# Patient Record
Sex: Male | Born: 1935 | Race: White | Hispanic: No | Marital: Married | Smoking: Former smoker
Health system: Southern US, Community
[De-identification: ages and names within clinical notes are randomized; demographics above are authoritative.]

## PROBLEM LIST (undated history)

## (undated) DIAGNOSIS — M159 Polyosteoarthritis, unspecified: Secondary | ICD-10-CM

## (undated) DIAGNOSIS — H269 Unspecified cataract: Secondary | ICD-10-CM

## (undated) DIAGNOSIS — H409 Unspecified glaucoma: Secondary | ICD-10-CM

## (undated) DIAGNOSIS — J449 Chronic obstructive pulmonary disease, unspecified: Secondary | ICD-10-CM

## (undated) DIAGNOSIS — J189 Pneumonia, unspecified organism: Secondary | ICD-10-CM

## (undated) DIAGNOSIS — I350 Nonrheumatic aortic (valve) stenosis: Secondary | ICD-10-CM

## (undated) DIAGNOSIS — K635 Polyp of colon: Secondary | ICD-10-CM

## (undated) DIAGNOSIS — I10 Essential (primary) hypertension: Secondary | ICD-10-CM

## (undated) DIAGNOSIS — K760 Fatty (change of) liver, not elsewhere classified: Secondary | ICD-10-CM

## (undated) DIAGNOSIS — K2270 Barrett's esophagus without dysplasia: Secondary | ICD-10-CM

## (undated) DIAGNOSIS — I509 Heart failure, unspecified: Secondary | ICD-10-CM

## (undated) DIAGNOSIS — I2510 Atherosclerotic heart disease of native coronary artery without angina pectoris: Secondary | ICD-10-CM

## (undated) DIAGNOSIS — I4891 Unspecified atrial fibrillation: Secondary | ICD-10-CM

## (undated) DIAGNOSIS — E785 Hyperlipidemia, unspecified: Secondary | ICD-10-CM

## (undated) DIAGNOSIS — IMO0001 Reserved for inherently not codable concepts without codable children: Secondary | ICD-10-CM

## (undated) DIAGNOSIS — M5030 Other cervical disc degeneration, unspecified cervical region: Secondary | ICD-10-CM

## (undated) DIAGNOSIS — E119 Type 2 diabetes mellitus without complications: Secondary | ICD-10-CM

## (undated) DIAGNOSIS — M109 Gout, unspecified: Secondary | ICD-10-CM

## (undated) DIAGNOSIS — C4491 Basal cell carcinoma of skin, unspecified: Secondary | ICD-10-CM

## (undated) DIAGNOSIS — Z9109 Other allergy status, other than to drugs and biological substances: Secondary | ICD-10-CM

## (undated) DIAGNOSIS — G629 Polyneuropathy, unspecified: Secondary | ICD-10-CM

## (undated) DIAGNOSIS — K219 Gastro-esophageal reflux disease without esophagitis: Secondary | ICD-10-CM

## (undated) DIAGNOSIS — C029 Malignant neoplasm of tongue, unspecified: Secondary | ICD-10-CM

## (undated) DIAGNOSIS — D759 Disease of blood and blood-forming organs, unspecified: Secondary | ICD-10-CM

## (undated) HISTORY — DX: Type 2 diabetes mellitus without complications: E11.9

## (undated) HISTORY — PX: OTHER SURGICAL HISTORY: SHX169

## (undated) HISTORY — PX: CATARACT EXTRACTION: SUR2

## (undated) HISTORY — DX: Gastro-esophageal reflux disease without esophagitis: K21.9

## (undated) HISTORY — DX: Essential (primary) hypertension: I10

## (undated) HISTORY — PX: HIP SURGERY: SHX245

## (undated) HISTORY — DX: Reserved for inherently not codable concepts without codable children: IMO0001

## (undated) HISTORY — PX: HIP ARTHROPLASTY: SHX981

---

## 2003-06-15 ENCOUNTER — Other Ambulatory Visit: Payer: Self-pay

## 2007-01-02 ENCOUNTER — Ambulatory Visit: Payer: Self-pay | Admitting: Unknown Physician Specialty

## 2008-02-11 ENCOUNTER — Ambulatory Visit: Payer: Self-pay | Admitting: Internal Medicine

## 2008-07-10 ENCOUNTER — Ambulatory Visit: Payer: Self-pay | Admitting: Specialist

## 2008-07-23 ENCOUNTER — Inpatient Hospital Stay: Payer: Self-pay | Admitting: Internal Medicine

## 2009-02-16 ENCOUNTER — Ambulatory Visit: Payer: Self-pay | Admitting: Internal Medicine

## 2009-10-13 ENCOUNTER — Ambulatory Visit: Payer: Self-pay | Admitting: Unknown Physician Specialty

## 2009-10-24 ENCOUNTER — Ambulatory Visit: Payer: Self-pay | Admitting: Unknown Physician Specialty

## 2010-01-11 ENCOUNTER — Ambulatory Visit: Payer: Self-pay | Admitting: Ophthalmology

## 2010-01-24 ENCOUNTER — Ambulatory Visit: Payer: Self-pay | Admitting: Ophthalmology

## 2010-09-30 ENCOUNTER — Observation Stay: Payer: Self-pay | Admitting: General Practice

## 2010-10-06 ENCOUNTER — Ambulatory Visit: Payer: Self-pay | Admitting: Otolaryngology

## 2010-10-11 ENCOUNTER — Ambulatory Visit: Payer: Self-pay | Admitting: Otolaryngology

## 2010-10-13 LAB — PATHOLOGY REPORT

## 2010-11-07 ENCOUNTER — Ambulatory Visit: Payer: Self-pay | Admitting: General Practice

## 2010-11-20 ENCOUNTER — Inpatient Hospital Stay: Payer: Self-pay | Admitting: General Practice

## 2011-02-21 ENCOUNTER — Ambulatory Visit: Payer: Self-pay | Admitting: Surgery

## 2011-03-02 ENCOUNTER — Ambulatory Visit: Payer: Self-pay | Admitting: General Practice

## 2011-03-08 ENCOUNTER — Ambulatory Visit: Payer: Self-pay | Admitting: Cardiology

## 2011-03-10 ENCOUNTER — Emergency Department: Payer: Self-pay | Admitting: *Deleted

## 2011-10-25 ENCOUNTER — Ambulatory Visit: Payer: Self-pay | Admitting: Internal Medicine

## 2011-10-30 ENCOUNTER — Ambulatory Visit: Payer: Self-pay | Admitting: Internal Medicine

## 2011-10-30 LAB — IRON AND TIBC
Iron Bind.Cap.(Total): 232 ug/dL — ABNORMAL LOW (ref 250–450)
Iron Saturation: 73 %
Unbound Iron-Bind.Cap.: 62 ug/dL

## 2011-10-30 LAB — CBC CANCER CENTER
Basophil #: 0 x10 3/mm (ref 0.0–0.1)
Basophil: 3 %
Comment - H1-Com1: NORMAL
Eosinophil: 2 %
MCH: 33.9 pg (ref 26.0–34.0)
MCV: 99 fL (ref 80–100)
Monocytes: 7 %
Platelet: 107 x10 3/mm — ABNORMAL LOW (ref 150–440)
RBC: 4.75 10*6/uL (ref 4.40–5.90)
Segmented Neutrophils: 44 %

## 2011-10-30 LAB — FERRITIN: Ferritin (ARMC): 2445 ng/mL — ABNORMAL HIGH (ref 8–388)

## 2011-10-30 LAB — APTT: Activated PTT: 37 secs — ABNORMAL HIGH (ref 23.6–35.9)

## 2011-10-30 LAB — FOLATE: Folic Acid: 18.5 ng/mL (ref 3.1–100.0)

## 2011-10-30 LAB — FIBRIN DEGRADATION PROD.(ARMC ONLY): Fibrin Degradation Prod.: <10 (ref 2.1–7.7)

## 2011-10-30 LAB — RETICULOCYTES
Absolute Retic Count: 0.0316 10*6/uL (ref 0.024–0.084)
Reticulocyte: 0.67 % (ref 0.5–1.5)

## 2011-10-31 LAB — URINE IEP, RANDOM

## 2011-11-07 ENCOUNTER — Ambulatory Visit: Payer: Self-pay | Admitting: Internal Medicine

## 2011-11-20 LAB — CBC CANCER CENTER
Basophil: 1 %
HGB: 14.6 g/dL (ref 13.0–18.0)
Lymphocytes: 29 %
MCH: 33.6 pg (ref 26.0–34.0)
MCV: 100 fL (ref 80–100)
Monocytes: 7 %
Platelet: 92 x10 3/mm — ABNORMAL LOW (ref 150–440)
RBC: 4.34 10*6/uL — ABNORMAL LOW (ref 4.40–5.90)

## 2011-11-20 LAB — RETICULOCYTES: Reticulocyte: 1.01 % (ref 0.5–1.5)

## 2011-11-26 ENCOUNTER — Ambulatory Visit: Payer: Self-pay | Admitting: Internal Medicine

## 2011-12-11 ENCOUNTER — Ambulatory Visit: Payer: Self-pay | Admitting: Internal Medicine

## 2011-12-19 ENCOUNTER — Emergency Department: Payer: Self-pay | Admitting: Emergency Medicine

## 2011-12-21 ENCOUNTER — Ambulatory Visit: Payer: Self-pay | Admitting: Internal Medicine

## 2011-12-27 ENCOUNTER — Ambulatory Visit: Payer: Self-pay | Admitting: Internal Medicine

## 2012-03-12 ENCOUNTER — Ambulatory Visit: Payer: Self-pay | Admitting: Internal Medicine

## 2012-03-12 LAB — CBC CANCER CENTER
Basophil %: 0.9 %
Eosinophil #: 0.2 x10 3/mm (ref 0.0–0.7)
Eosinophil %: 4.2 %
HCT: 46.7 % (ref 40.0–52.0)
HGB: 15.7 g/dL (ref 13.0–18.0)
Lymphocyte %: 29.9 %
MCH: 34.8 pg — ABNORMAL HIGH (ref 26.0–34.0)
MCHC: 33.6 g/dL (ref 32.0–36.0)
Monocyte #: 0.5 x10 3/mm (ref 0.2–1.0)
Neutrophil #: 1.9 x10 3/mm (ref 1.4–6.5)
Neutrophil %: 51.5 %
RDW: 12.8 % (ref 11.5–14.5)

## 2012-03-28 ENCOUNTER — Ambulatory Visit: Payer: Self-pay | Admitting: Internal Medicine

## 2012-04-16 ENCOUNTER — Encounter: Payer: Self-pay | Admitting: Internal Medicine

## 2012-04-23 ENCOUNTER — Ambulatory Visit: Payer: Self-pay | Admitting: Unknown Physician Specialty

## 2012-04-25 LAB — PATHOLOGY REPORT

## 2012-04-27 ENCOUNTER — Encounter: Payer: Self-pay | Admitting: Internal Medicine

## 2012-05-28 ENCOUNTER — Ambulatory Visit: Payer: Self-pay | Admitting: Internal Medicine

## 2012-06-12 LAB — CBC CANCER CENTER
Basophil %: 0.6 %
Eosinophil #: 0.2 x10 3/mm (ref 0.0–0.7)
HCT: 46.7 % (ref 40.0–52.0)
HGB: 16.5 g/dL (ref 13.0–18.0)
Lymphocyte #: 1.2 x10 3/mm (ref 1.0–3.6)
MCH: 35.5 pg — ABNORMAL HIGH (ref 26.0–34.0)
MCV: 100 fL (ref 80–100)
Monocyte #: 0.3 x10 3/mm (ref 0.2–1.0)
Platelet: 112 x10 3/mm — ABNORMAL LOW (ref 150–440)
RDW: 12.6 % (ref 11.5–14.5)
WBC: 3.4 x10 3/mm — ABNORMAL LOW (ref 3.8–10.6)

## 2012-06-28 ENCOUNTER — Ambulatory Visit: Payer: Self-pay | Admitting: Internal Medicine

## 2012-09-08 ENCOUNTER — Ambulatory Visit: Payer: Self-pay | Admitting: Internal Medicine

## 2012-09-10 LAB — CBC CANCER CENTER
Basophil #: 0 x10 3/mm (ref 0.0–0.1)
Eosinophil #: 0.2 x10 3/mm (ref 0.0–0.7)
HCT: 49 % (ref 40.0–52.0)
MCH: 35.6 pg — ABNORMAL HIGH (ref 26.0–34.0)
MCHC: 34.9 g/dL (ref 32.0–36.0)
MCV: 102 fL — ABNORMAL HIGH (ref 80–100)
Monocyte #: 0.4 x10 3/mm (ref 0.2–1.0)
Monocyte %: 9.9 %
Neutrophil %: 62.5 %
Platelet: 130 x10 3/mm — ABNORMAL LOW (ref 150–440)
RBC: 4.8 10*6/uL (ref 4.40–5.90)
RDW: 12.9 % (ref 11.5–14.5)

## 2012-09-10 LAB — FOLATE: Folic Acid: 17.9 ng/mL (ref 3.1–100.0)

## 2012-09-25 ENCOUNTER — Ambulatory Visit: Payer: Self-pay | Admitting: Internal Medicine

## 2012-10-09 IMAGING — US US EXTREM LOW VENOUS*L*
1 series · 14 of 24 positions shown · non-contrast
Comparison: none

REASON FOR EXAM: STAT CR [id number] PGR Dr.Sarang  pain and swelling left
leg  eval DVT
COMMENTS:

PROCEDURE:     US  - US DOPPLER LOW EXTR LEFT  - December 21, 2011  [DATE]
RESULT:     The left femoral and popliteal veins are normally compressible.
The waveform patterns are normal and the color flow images are normal. The
response to the augmentation maneuver is normal.

[Series 1: us extrem low venous*left* · 0.09mm/px · 14 of 30 slices shown]
[im 1/30]
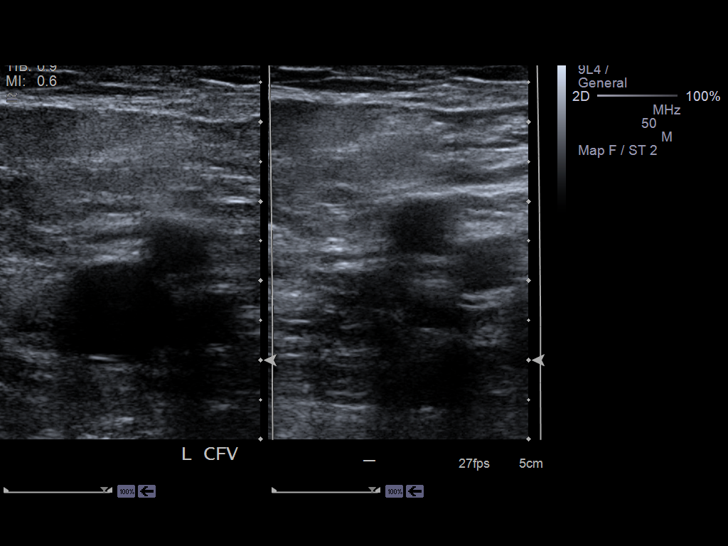
[im 3/30]
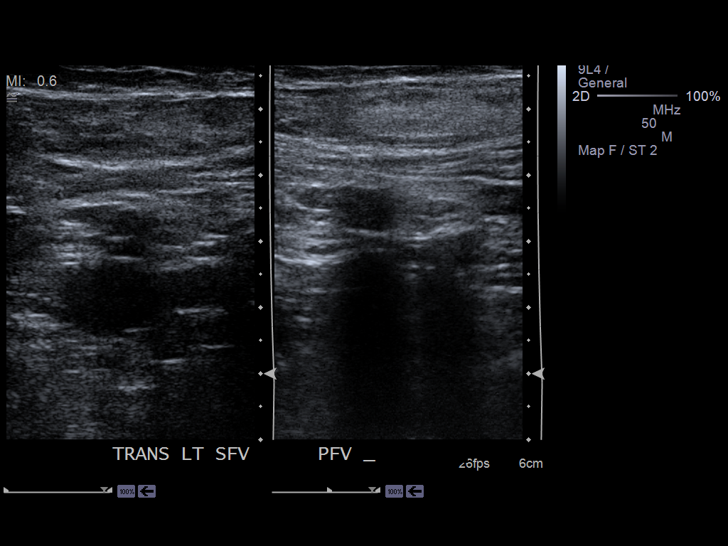
[im 6/30]
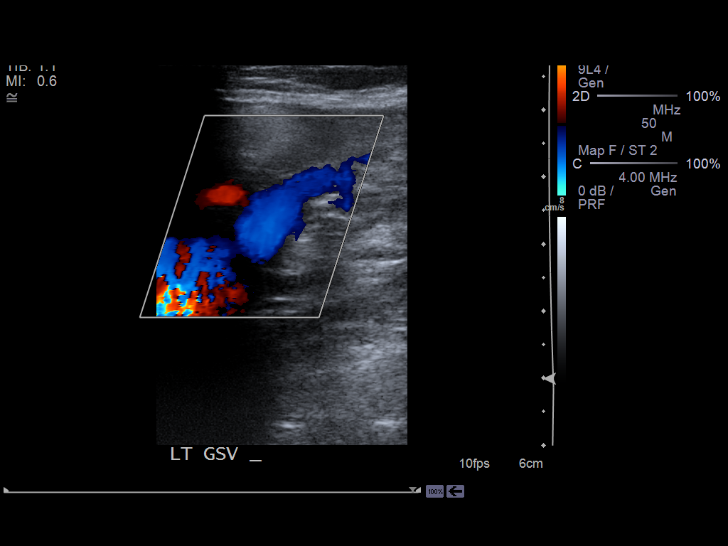
[im 8/30]
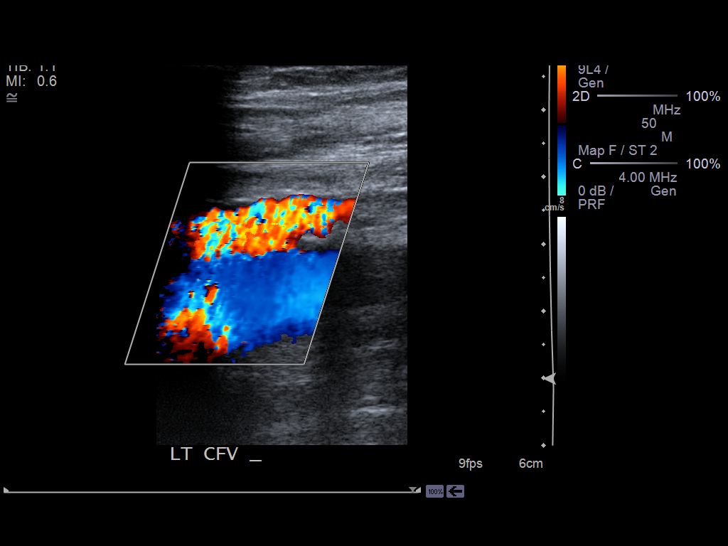
[im 9/30]
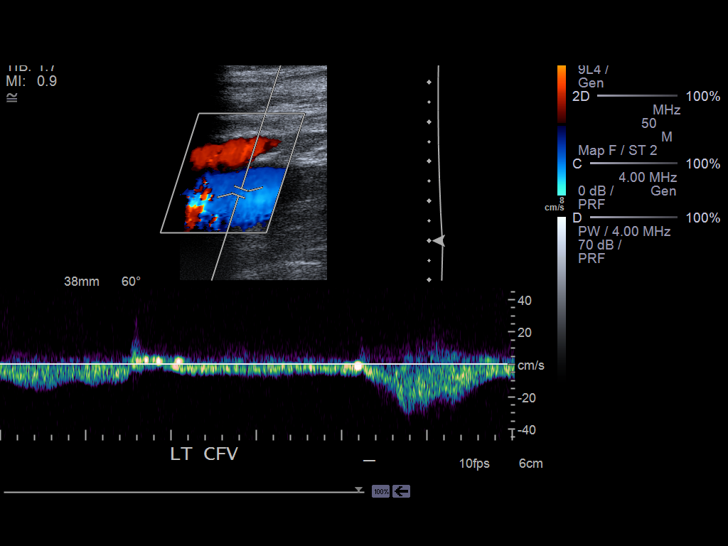
[im 12/30]
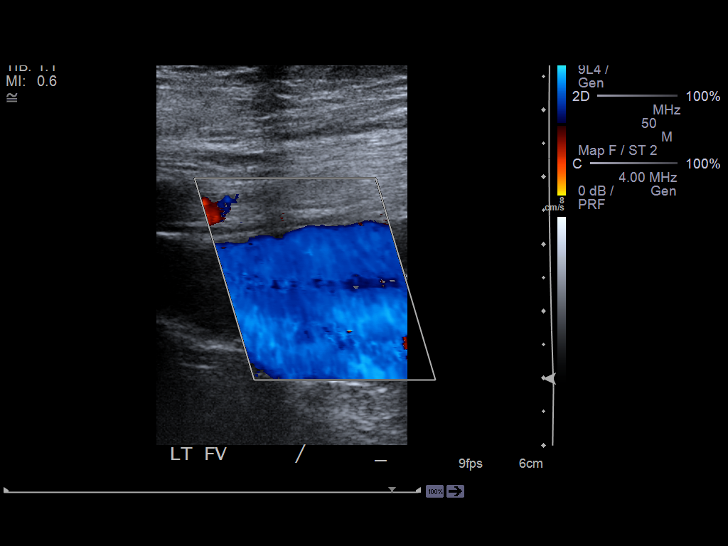
[im 14/30]
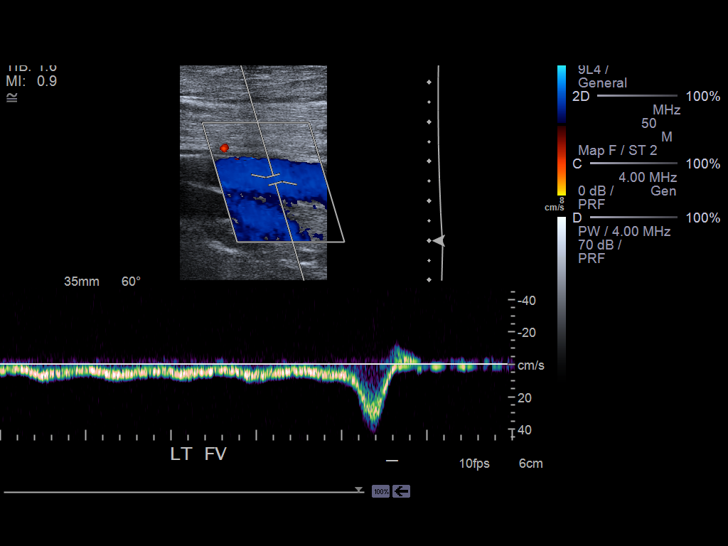
[im 16/30]
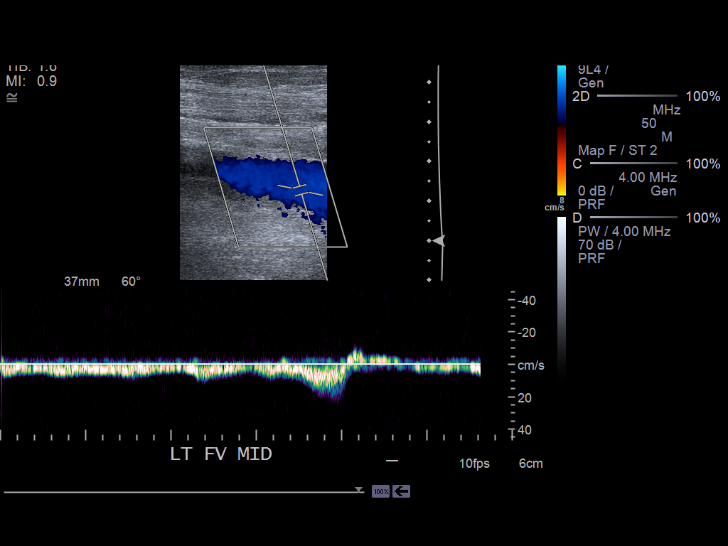
[im 18/30]
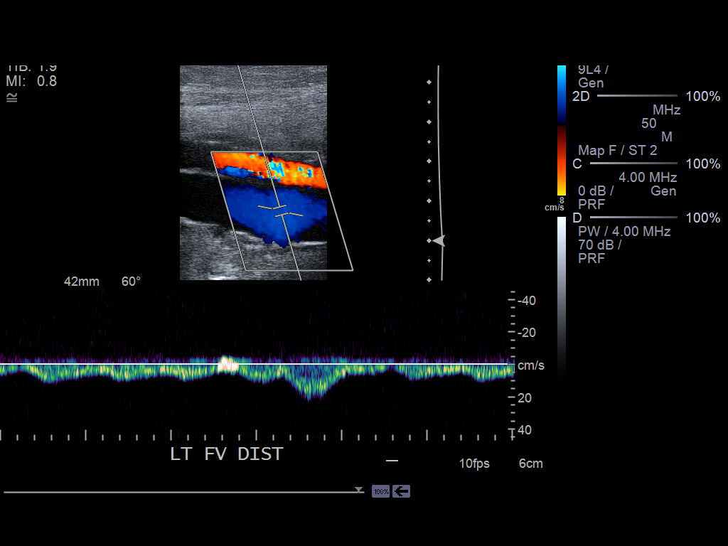
[im 21/30]
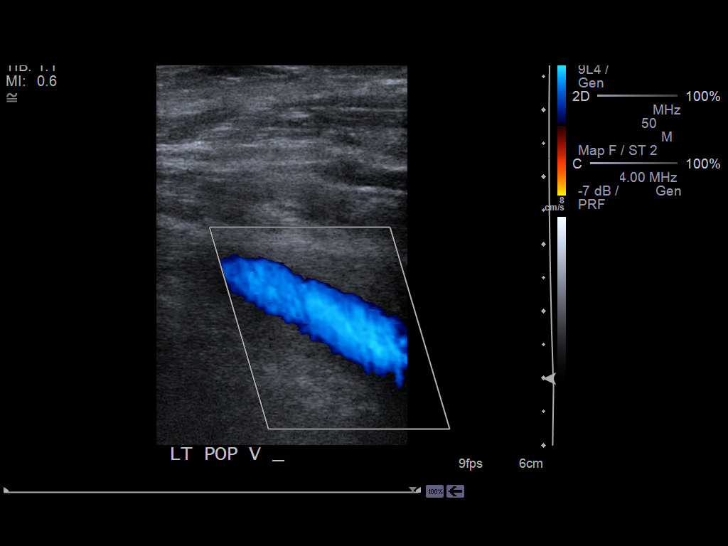
[im 23/30]
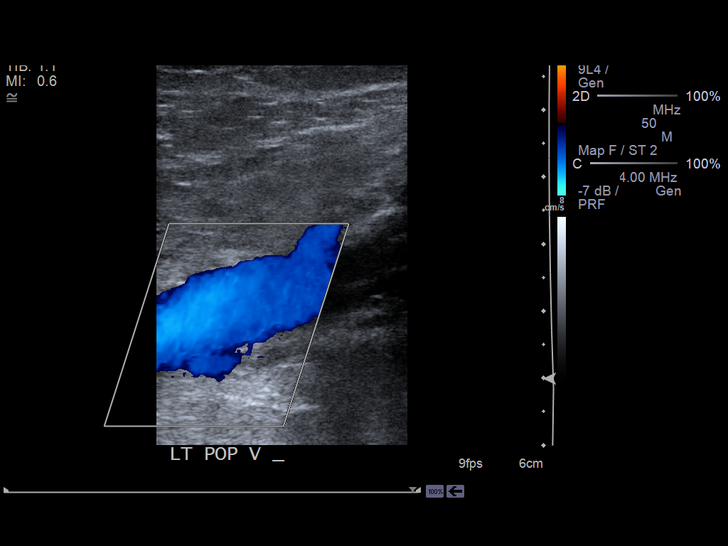
[im 24/30]
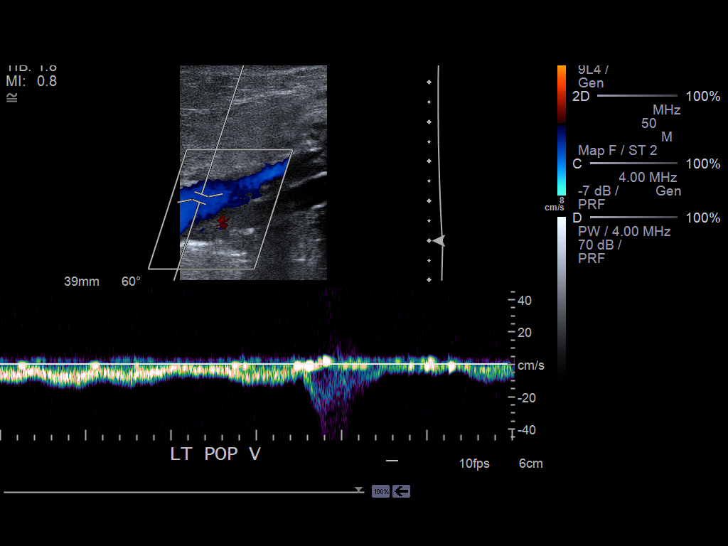
[im 27/30]
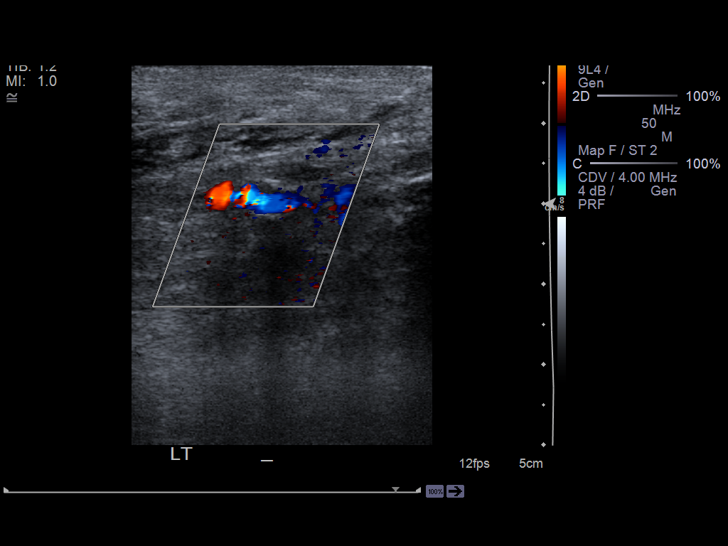
[im 30/30]
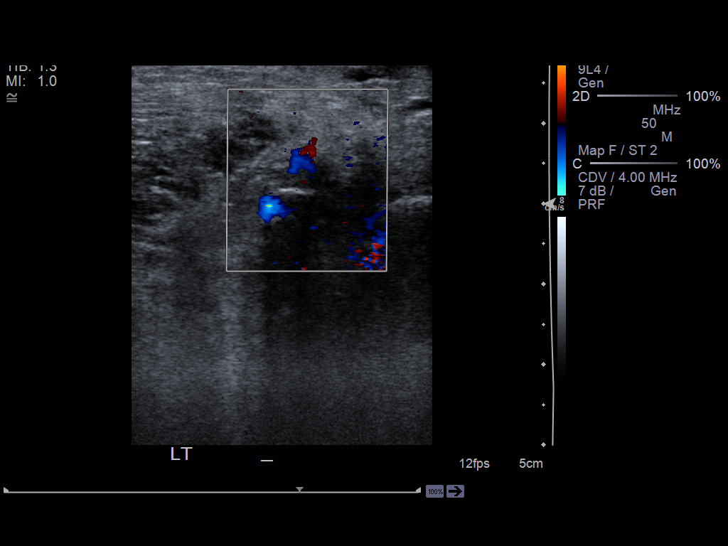

[14 of 24 positions shown; findings below may reference images not displayed]

IMPRESSION: There is no evidence of thrombus within the left femoral or
popliteal veins.

## 2012-11-11 ENCOUNTER — Ambulatory Visit: Payer: Self-pay | Admitting: Rheumatology

## 2012-11-25 ENCOUNTER — Ambulatory Visit: Payer: Self-pay | Admitting: Internal Medicine

## 2012-12-24 LAB — CBC CANCER CENTER
Basophil %: 1.3 %
Eosinophil #: 0.3 x10 3/mm (ref 0.0–0.7)
HCT: 41.9 % (ref 40.0–52.0)
HGB: 15.1 g/dL (ref 13.0–18.0)
Lymphocyte #: 1.1 x10 3/mm (ref 1.0–3.6)
Lymphocyte %: 40.6 %
MCHC: 36 g/dL (ref 32.0–36.0)
MCV: 102 fL — ABNORMAL HIGH (ref 80–100)
Monocyte #: 0.4 x10 3/mm (ref 0.2–1.0)
Monocyte %: 14.2 %
Platelet: 99 x10 3/mm — ABNORMAL LOW (ref 150–440)
RBC: 4.11 10*6/uL — ABNORMAL LOW (ref 4.40–5.90)
RDW: 12.9 % (ref 11.5–14.5)
WBC: 2.8 x10 3/mm — ABNORMAL LOW (ref 3.8–10.6)

## 2012-12-26 ENCOUNTER — Ambulatory Visit: Payer: Self-pay | Admitting: Internal Medicine

## 2013-04-29 ENCOUNTER — Ambulatory Visit: Payer: Self-pay | Admitting: Internal Medicine

## 2013-04-29 LAB — CBC CANCER CENTER
Basophil #: 0 x10 3/mm (ref 0.0–0.1)
HCT: 43.6 % (ref 40.0–52.0)
Lymphocyte #: 0.9 x10 3/mm — ABNORMAL LOW (ref 1.0–3.6)
MCH: 35.4 pg — ABNORMAL HIGH (ref 26.0–34.0)
MCHC: 34.3 g/dL (ref 32.0–36.0)
Monocyte %: 14.6 %
Platelet: 87 x10 3/mm — ABNORMAL LOW (ref 150–440)
RBC: 4.22 10*6/uL — ABNORMAL LOW (ref 4.40–5.90)
RDW: 12.9 % (ref 11.5–14.5)
WBC: 2.3 x10 3/mm — ABNORMAL LOW (ref 3.8–10.6)

## 2013-05-06 ENCOUNTER — Encounter: Payer: Self-pay | Admitting: Podiatry

## 2013-05-06 ENCOUNTER — Ambulatory Visit (INDEPENDENT_AMBULATORY_CARE_PROVIDER_SITE_OTHER): Payer: Medicare Other | Admitting: Podiatry

## 2013-05-06 VITALS — BP 138/63 | HR 65 | Resp 16 | Ht 76.0 in | Wt 250.0 lb

## 2013-05-06 DIAGNOSIS — B351 Tinea unguium: Secondary | ICD-10-CM

## 2013-05-06 DIAGNOSIS — M79609 Pain in unspecified limb: Secondary | ICD-10-CM

## 2013-05-06 NOTE — Progress Notes (Signed)
   Subjective:    Patient ID: [name], male    DOB: Sep 08, 1931, 79 y.o.   MRN: [id number]  HPI Comments: "just the toenails"     Review of Systems     Objective:   Physical Exam: Pulses are palpable bilateral. Nails are thick yellow dystrophic clinically mycotic and painful on palpation to        Assessment & Plan:  Assessment: Pain in limb secondary to onychomycosis.  Plan: Debridement of nails 1 through 5 bilateral covered service secondary to pain. Followup with her in 3 months.

## 2013-05-28 ENCOUNTER — Ambulatory Visit: Payer: Self-pay | Admitting: Internal Medicine

## 2013-08-05 ENCOUNTER — Ambulatory Visit: Payer: No Typology Code available for payment source | Admitting: Podiatry

## 2013-08-05 ENCOUNTER — Ambulatory Visit (INDEPENDENT_AMBULATORY_CARE_PROVIDER_SITE_OTHER): Payer: Medicare Other | Admitting: Podiatry

## 2013-08-05 VITALS — BP 78/59 | HR 77 | Resp 16 | Ht 76.0 in | Wt 250.0 lb

## 2013-08-05 DIAGNOSIS — M79609 Pain in unspecified limb: Secondary | ICD-10-CM

## 2013-08-05 DIAGNOSIS — B351 Tinea unguium: Secondary | ICD-10-CM

## 2013-08-05 NOTE — Progress Notes (Signed)
He presents today with a chief complaint of painful toenails one through 5 bilateral.  Objective: Vital signs are stable he is alert and oriented x3. Pulses are palpable bilateral. Nails are thick yellow dystrophic clinically mycotic and painful palpation as well as debridement.  Assessment: Pain in limb secondary to onychomycosis 1 through 5 bilateral.  Plan: Debridement of nails 1 through 5 bilateral.

## 2013-08-26 ENCOUNTER — Ambulatory Visit: Payer: Self-pay | Admitting: Internal Medicine

## 2013-08-26 LAB — CBC CANCER CENTER
Basophil #: 0 x10 3/mm (ref 0.0–0.1)
Basophil %: 0.6 %
Eosinophil #: 0.2 x10 3/mm (ref 0.0–0.7)
Eosinophil %: 5.1 %
HCT: 44.8 % (ref 40.0–52.0)
HGB: 15.2 g/dL (ref 13.0–18.0)
LYMPHS ABS: 0.9 x10 3/mm — AB (ref 1.0–3.6)
Lymphocyte %: 21.9 %
MCH: 35.1 pg — ABNORMAL HIGH (ref 26.0–34.0)
MCHC: 34 g/dL (ref 32.0–36.0)
MCV: 103 fL — AB (ref 80–100)
Monocyte #: 0.4 x10 3/mm (ref 0.2–1.0)
Monocyte %: 10.9 %
NEUTROS ABS: 2.4 x10 3/mm (ref 1.4–6.5)
Neutrophil %: 61.5 %
Platelet: 93 x10 3/mm — ABNORMAL LOW (ref 150–440)
RBC: 4.34 10*6/uL — AB (ref 4.40–5.90)
RDW: 12.9 % (ref 11.5–14.5)
WBC: 3.9 x10 3/mm (ref 3.8–10.6)

## 2013-08-26 LAB — FERRITIN: Ferritin (ARMC): 388 ng/mL (ref 8–388)

## 2013-08-26 LAB — FOLATE: Folic Acid: 28.4 ng/mL (ref 3.1–100.0)

## 2013-09-25 ENCOUNTER — Ambulatory Visit: Payer: Self-pay | Admitting: Internal Medicine

## 2013-11-04 ENCOUNTER — Ambulatory Visit: Payer: No Typology Code available for payment source | Admitting: Podiatry

## 2013-11-11 ENCOUNTER — Ambulatory Visit (INDEPENDENT_AMBULATORY_CARE_PROVIDER_SITE_OTHER): Payer: Medicare Other | Admitting: Podiatry

## 2013-11-11 VITALS — BP 134/46 | HR 83 | Resp 16

## 2013-11-11 DIAGNOSIS — B351 Tinea unguium: Secondary | ICD-10-CM

## 2013-11-11 DIAGNOSIS — M79609 Pain in unspecified limb: Secondary | ICD-10-CM

## 2013-11-11 NOTE — Progress Notes (Signed)
He presents today complaining of painful elongated toenails.  Objective: Pulses are palpable bilateral. Nails are thick yellow dystrophic with mycotic and painful palpation.  Assessment: Pain in limb secondary to onychomycosis 1 through 5 bilateral.  Plan: Debridement of nails 1 through 5 bilateral covered service secondary to pain.

## 2013-12-16 ENCOUNTER — Ambulatory Visit: Payer: Self-pay | Admitting: Unknown Physician Specialty

## 2013-12-21 ENCOUNTER — Ambulatory Visit (INDEPENDENT_AMBULATORY_CARE_PROVIDER_SITE_OTHER): Payer: Medicare Other | Admitting: Podiatry

## 2013-12-21 ENCOUNTER — Encounter: Payer: Self-pay | Admitting: Podiatry

## 2013-12-21 DIAGNOSIS — L03039 Cellulitis of unspecified toe: Secondary | ICD-10-CM

## 2013-12-21 NOTE — Patient Instructions (Signed)

## 2013-12-21 NOTE — Progress Notes (Signed)
[name] presents today chief complaint of trauma to his hallux nail right foot. He states that the nail was split.  Objective: Vital signs are stable he is alert and oriented x3. Pulses are palpable right lower extremity. Nail split and nearly avulsed hallux right along the fibular portion. There is no purulence but malodor is noted.  Assessment: Traumatic paronychia hallux right.  Plan: Partial temporary nail avulsion was performed after local anesthetic had been administered to the hallux right. He tolerated procedure well and I will followup with him in one week he was start soaking twice daily in Betadine and water.

## 2013-12-24 ENCOUNTER — Ambulatory Visit: Payer: Self-pay | Admitting: Internal Medicine

## 2013-12-25 LAB — CBC CANCER CENTER
Basophil #: 0 x10 3/mm (ref 0.0–0.1)
Basophil %: 1.3 %
Eosinophil #: 0.2 x10 3/mm (ref 0.0–0.7)
Eosinophil %: 6.2 %
HCT: 46.3 % (ref 40.0–52.0)
HGB: 15.5 g/dL (ref 13.0–18.0)
LYMPHS PCT: 29 %
Lymphocyte #: 0.9 x10 3/mm — ABNORMAL LOW (ref 1.0–3.6)
MCH: 34.8 pg — ABNORMAL HIGH (ref 26.0–34.0)
MCHC: 33.6 g/dL (ref 32.0–36.0)
MCV: 104 fL — AB (ref 80–100)
Monocyte #: 0.4 x10 3/mm (ref 0.2–1.0)
Monocyte %: 12.8 %
Neutrophil #: 1.5 x10 3/mm (ref 1.4–6.5)
Neutrophil %: 50.7 %
PLATELETS: 122 x10 3/mm — AB (ref 150–440)
RBC: 4.46 10*6/uL (ref 4.40–5.90)
RDW: 13.2 % (ref 11.5–14.5)
WBC: 3 x10 3/mm — AB (ref 3.8–10.6)

## 2013-12-26 ENCOUNTER — Ambulatory Visit: Payer: Self-pay | Admitting: Internal Medicine

## 2013-12-30 ENCOUNTER — Ambulatory Visit (INDEPENDENT_AMBULATORY_CARE_PROVIDER_SITE_OTHER): Payer: Medicare Other | Admitting: Podiatry

## 2013-12-30 VITALS — BP 133/61 | HR 76 | Resp 16

## 2013-12-30 DIAGNOSIS — M79676 Pain in unspecified toe(s): Secondary | ICD-10-CM

## 2013-12-30 DIAGNOSIS — M79609 Pain in unspecified limb: Secondary | ICD-10-CM

## 2013-12-30 NOTE — Progress Notes (Signed)
He presents today for followup of his partial nail avulsion hallux right.   objective: Vital signs are stable he is alert and oriented x3. There is no erythema edema cellulitis drainage or odor to the hallux right.  Assessment: Well-healing surgical toe hallux right.  Plan: Discontinue Betadine start with Epsom salts warm or soaks covered in the day and leave open at night. Continue soaking to completely resolved.

## 2014-01-13 ENCOUNTER — Ambulatory Visit: Payer: Self-pay | Admitting: Unknown Physician Specialty

## 2014-01-22 ENCOUNTER — Emergency Department: Payer: Self-pay | Admitting: Internal Medicine

## 2014-02-10 ENCOUNTER — Ambulatory Visit (INDEPENDENT_AMBULATORY_CARE_PROVIDER_SITE_OTHER): Payer: Medicare Other | Admitting: Podiatry

## 2014-02-10 ENCOUNTER — Ambulatory Visit: Payer: Medicare Other | Admitting: Podiatry

## 2014-02-10 DIAGNOSIS — B351 Tinea unguium: Secondary | ICD-10-CM

## 2014-02-10 DIAGNOSIS — M79676 Pain in unspecified toe(s): Secondary | ICD-10-CM

## 2014-02-10 DIAGNOSIS — M79609 Pain in unspecified limb: Secondary | ICD-10-CM

## 2014-02-10 NOTE — Progress Notes (Signed)
He presents today with a chief complaint of painful elongated toenails and calluses plantar aspect of the left foot. Has recently had cellulitis of the right leg secondary to be dermatological surgical procedure.  Objective: Pulses are palpable bilateral. Nails are thick yellow dystrophic with mycotic and painful palpation.  Assessment: Pain in limb secondary to onychomycosis 1 through 5 bilateral.  Plan: Debridement of nails 1 through 5 bilateral covered service secondary to pain.

## 2014-04-19 ENCOUNTER — Ambulatory Visit: Payer: Medicare Other | Admitting: Podiatry

## 2014-06-22 ENCOUNTER — Ambulatory Visit: Payer: Self-pay | Admitting: General Practice

## 2014-06-22 DIAGNOSIS — I10 Essential (primary) hypertension: Secondary | ICD-10-CM | POA: Diagnosis not present

## 2014-06-22 LAB — CBC
HCT: 40.3 % (ref 40.0–52.0)
HGB: 13.5 g/dL (ref 13.0–18.0)
MCH: 32.5 pg (ref 26.0–34.0)
MCHC: 33.5 g/dL (ref 32.0–36.0)
MCV: 97 fL (ref 80–100)
Platelet: 86 10*3/uL — ABNORMAL LOW (ref 150–440)
RBC: 4.16 10*6/uL — ABNORMAL LOW (ref 4.40–5.90)
RDW: 13.3 % (ref 11.5–14.5)
WBC: 2.8 10*3/uL — AB (ref 3.8–10.6)

## 2014-06-22 LAB — URINALYSIS, COMPLETE
Bacteria: NONE SEEN
Bilirubin,UR: NEGATIVE
Blood: NEGATIVE
Glucose,UR: NEGATIVE mg/dL (ref 0–75)
KETONE: NEGATIVE
Leukocyte Esterase: NEGATIVE
Nitrite: NEGATIVE
PH: 6 (ref 4.5–8.0)
Protein: NEGATIVE
SPECIFIC GRAVITY: 1.013 (ref 1.003–1.030)
Squamous Epithelial: NONE SEEN
WBC UR: 2 /HPF (ref 0–5)

## 2014-06-22 LAB — BASIC METABOLIC PANEL
ANION GAP: 10 (ref 7–16)
BUN: 8 mg/dL (ref 7–18)
CHLORIDE: 103 mmol/L (ref 98–107)
CO2: 26 mmol/L (ref 21–32)
CREATININE: 0.68 mg/dL (ref 0.60–1.30)
Calcium, Total: 8.9 mg/dL (ref 8.5–10.1)
EGFR (African American): >60
EGFR (Non-African Amer.): >60
Glucose: 193 mg/dL — ABNORMAL HIGH (ref 65–99)
Osmolality: 281 (ref 275–301)
POTASSIUM: 3.6 mmol/L (ref 3.5–5.1)
Sodium: 139 mmol/L (ref 136–145)

## 2014-06-22 LAB — HEMOGLOBIN A1C: Hemoglobin A1C: 7 % — ABNORMAL HIGH (ref 4.2–6.3)

## 2014-06-22 LAB — PROTIME-INR
INR: 1.1
Prothrombin Time: 14 secs (ref 11.5–14.7)

## 2014-06-22 LAB — SEDIMENTATION RATE: Erythrocyte Sed Rate: 17 mm/hr (ref 0–20)

## 2014-06-22 LAB — MRSA PCR SCREENING

## 2014-06-22 LAB — APTT: Activated PTT: 34.9 secs (ref 23.6–35.9)

## 2014-06-23 LAB — URINE CULTURE

## 2014-06-28 ENCOUNTER — Ambulatory Visit: Payer: Self-pay | Admitting: Internal Medicine

## 2014-06-30 LAB — CBC CANCER CENTER
Basophil #: 0.1 "x10 3/mm "
Basophil %: 0.6 %
Eosinophil #: 0.2 "x10 3/mm "
Eosinophil %: 2.2 %
HCT: 41.5 %
HGB: 13.8 g/dL
Lymphocyte %: 13.7 %
Lymphs Abs: 1.1 "x10 3/mm "
MCH: 31.9 pg
MCHC: 33.3 g/dL
MCV: 96 fL
Monocyte #: 0.6 "x10 3/mm "
Monocyte %: 7.4 %
Neutrophil #: 6 "x10 3/mm "
Neutrophil %: 76.1 %
Platelet: 101 "x10 3/mm " — ABNORMAL LOW
RBC: 4.34 "x10 6/mm " — ABNORMAL LOW
RDW: 13.2 %
WBC: 7.9 "x10 3/mm "

## 2014-07-02 LAB — CBC CANCER CENTER
BASOS ABS: 0.1 x10 3/mm (ref 0.0–0.1)
Basophil %: 1.2 %
EOS PCT: 3.2 %
Eosinophil #: 0.1 x10 3/mm (ref 0.0–0.7)
HCT: 40.2 % (ref 40.0–52.0)
HGB: 13.8 g/dL (ref 13.0–18.0)
LYMPHS PCT: 21.2 %
Lymphocyte #: 0.9 x10 3/mm — ABNORMAL LOW (ref 1.0–3.6)
MCH: 32.7 pg (ref 26.0–34.0)
MCHC: 34.5 g/dL (ref 32.0–36.0)
MCV: 95 fL (ref 80–100)
MONO ABS: 0.6 x10 3/mm (ref 0.2–1.0)
Monocyte %: 13.5 %
Neutrophil #: 2.6 x10 3/mm (ref 1.4–6.5)
Neutrophil %: 60.9 %
Platelet: 104 x10 3/mm — ABNORMAL LOW (ref 150–440)
RBC: 4.23 10*6/uL — ABNORMAL LOW (ref 4.40–5.90)
RDW: 12.9 % (ref 11.5–14.5)
WBC: 4.3 x10 3/mm (ref 3.8–10.6)

## 2014-07-05 ENCOUNTER — Inpatient Hospital Stay: Payer: Self-pay | Admitting: General Practice

## 2014-07-08 ENCOUNTER — Encounter: Payer: Self-pay | Admitting: Internal Medicine

## 2014-07-27 ENCOUNTER — Ambulatory Visit
Admit: 2014-07-27 | Disposition: A | Discharge status: Home / Self Care | Payer: Self-pay | Attending: Internal Medicine | Admitting: Internal Medicine

## 2014-07-27 ENCOUNTER — Encounter
Admit: 2014-07-27 | Disposition: A | Discharge status: Home / Self Care | Payer: Self-pay | Attending: Internal Medicine | Admitting: Internal Medicine

## 2014-08-09 ENCOUNTER — Encounter: Admit: 2014-08-09 | Disposition: A | Discharge status: Home / Self Care | Payer: Self-pay | Admitting: Surgery

## 2014-08-27 ENCOUNTER — Encounter
Admit: 2014-08-27 | Disposition: A | Discharge status: Home / Self Care | Payer: Self-pay | Attending: Internal Medicine | Admitting: Internal Medicine

## 2014-09-26 NOTE — Discharge Summary (Signed)
PATIENT NAME:  [name], [name] MR#:  [id number] DATE OF BIRTH:  [birth year]  DATE OF ADMISSION:  07/05/2014 DATE OF DISCHARGE:  07/08/2014  ADMITTING DIAGNOSIS: Degenerative arthrosis of the left knee.   DISCHARGE DIAGNOSIS: Degenerative arthrosis of the left knee.   OPERATION: On 07/05/2014 the patient had a left total knee arthroplasty using computer-aided navigation.   SURGEON: Laurice Record. Holley Bouche., MD   ASSISTANT: Vance Peper, PA    ANESTHESIA: Spinal.   ESTIMATED BLOOD LOSS: 100 mL.   IMPLANTS USED: DePuy PFC Sigma size 5 posterior stabilized femoral component that was cemented, size 6 MBT tibial component cemented, 38 mm 3 peg oval dome patella that was cemented, 12.5 mm stabilized rotating platform polyethylene insert.   The patient was stabilized, brought to the recovery room, and then brought down to the orthopedic floor where he was treated for pain control and physical therapy.   HISTORY OF PRESENT ILLNESS: The patient is an 81 year old male who presented for upcoming left total knee replacement. The patient has been refractory to conservative treatment using ambulatory aids as well as cortisone injections and activities of daily living that have been limited. The patient has continued to have pain and difficulty.   PHYSICAL EXAMINATION:  GENERAL: Alert elderly male with stiff-legged gait and a varus thrust to the left knee.  LUNGS: Clear to auscultation.  CARDIOVASCULAR: Regular rate and rhythm.  MUSCULOSKELETAL: In regard to the left knee, the patient has no soft tissue swelling. The patient has medial joint line tenderness. The patient has range of motion with full extension to 100 degrees of flexion with mild pain with flexion.   HOSPITAL COURSE: After initial admission on 07/05/2014 the patient was brought to the orthopedic floor. On postop day 1, the patient had a hemoglobin of 12.2, which dropped down to 11.6 with no transfusion given. The patient did physical therapy  initially bed to chair and progressed up to ambulating about 300 feet but with some difficulty and multiple stops. The patient did have bowel movement on postop day 2. The patient was ready to go to rehab based on the fact that his family support is very limited and his wife has difficulty with assistance. The patient was sent to rehab on 07/08/2014.   CONDITION AT DISCHARGE: Stable.   DISPOSITION: The patient was sent to rehab for physical therapy and occupational therapy.   DISCHARGE INSTRUCTIONS: The patient will do weight bear as tolerated on the affected leg. The patient will elevate his leg with 1 to 2 pillows, use thigh-high TED hose on both legs, remove 1 hour every 8 hour shift. The patient will do elevation of his heels off the bed and use incentive spirometer every hour as well as be encouraged to do cough and deep breathing. The patient will continue the Polar Care for swelling, try not to get his dressing wet or dirty and leave the dressing on except change as needed by physical therapy and the nursing staff. The patient will call the clinic if there is any bright red bleeding, calf pain, or bowel or bladder difficulty or fever greater than 101.5. The patient will do physical therapy and occupational therapy per protocol. The patient will have his staples removed on 07/20/2014 with his appointment with Vance Peper.   DISCHARGE MEDICATIONS: Metoprolol 25 mg 1 tablet daily, vitamin B complex 100 mg 1 tablet daily, Centrum Silver 1 tablet daily. Montelukast 10 mg 1 tablet daily, vitamin E 400 units 1 capsule daily, timolol 0.5% ophthalmic  solution 1 drop both eyes daily, fluticasone 50 mcg inhalation 2 sprays daily, Flexeril 10 mg 1 tablet daily or p.r.n., Prilosec 40 mg 1 capsule daily, hydrochlorothiazide/quinapril 12.5/20 mg 1 tablet daily, ipratropium bromide 2 sprays nasally 3 times a day, azelastine  nasal 137 mcg 1 spray twice a day, promethazine 25 mg q. 6 hours as needed for nausea., fish  oil 1000 mg 1 capsule b.i.d., vitamin C 1000 mg 1 capsule daily, vitamin D3 2000 international units 1 tablet daily, Tylenol 500 mg 1 tablet q. 4 hours as needed for fever, oxycodone 5 mg 1 tablet q. 4 hours as needed for moderate pain, tramadol 50 mg 1 tablet q. 4 hours as needed for mild to moderate pain, Lovenox 40 mg subcutaneous every 12 hours for 14 days then discontinue, begin enteric-coated aspirin once a day, milk of magnesia 8% 15 mL once a day at bedtime.     ____________________________ Lenna Sciara. Reche Dixon, [place] jtm:AT D: 07/08/2014 06:42:00 ET T: 07/08/2014 07:09:30 ET JOB#: [id number]  cc: J. Reche Dixon, [place], <Dictator> J Gr[facility] PA ELECTRONICALLY SIGNED 07/15/2014 15:50

## 2014-09-26 NOTE — Op Note (Signed)
PATIENT NAME:  [name], [name] DATE OF BIRTH:  Sep 21, 1931  DATE OF PROCEDURE:  07/05/2014  PREOPERATIVE DIAGNOSIS:  Degenerative arthrosis of the left knee (primary).   POSTOPERATIVE DIAGNOSIS:  Degenerative arthrosis of the left knee (primary).   PROCEDURE PERFORMED:  Left total knee arthroplasty using computer-assisted navigation.   SURGEON:  Laurice Record. Holley Bouche., MD   ASSISTANT:  Vance Peper, PA (required to maintain retraction throughout the procedure).   ANESTHESIA:  Spinal.   ESTIMATED BLOOD LOSS:  100 mL.   FLUIDS REPLACED:  2000 mL of crystalloid.   TOURNIQUET TIME:  122 minutes.   DRAINS:  Two medium drains to reinfusion system.   SOFT TISSUE RELEASES:  Anterior cruciate ligament, posterior cruciate ligament, deep medial collateral ligament, and patellofemoral ligament.   IMPLANTS UTILIZED:  DePuy PFC Sigma size 5 posterior stabilized femoral component (cemented), size 6 MBT tibial component (cemented), 38 mm 3-peg oval dome patella (cemented), and 12.5 mm stabilized rotating platform polyethylene insert.   INDICATIONS FOR SURGERY:  The patient is an 81 year old gentleman who has been seen for complaints of progressive decreased range of motion of the left knee and pain. X-rays demonstrated severe degenerative changes in tricompartmental fashion with prominent osteophytes and relative varus deformity. He was also noted to have flexion contracture. After discussion of the risks, benefits, and surgical intervention, the patient expressed understanding of the risks and benefits and agreed with plans for surgical intervention.   PROCEDURE IN DETAIL:  The patient was brought into the operating room, and after adequate spinal anesthesia was achieved, a tourniquet was placed on the patient's upper left thigh. The patient's left knee and leg were cleaned and prepped with alcohol and DuraPrep and draped in the usual sterile fashion. A "timeout" was performed as per usual  protocol. The left lower extremity was exsanguinated using an Esmarch, and the tourniquet was inflated to 300 mmHg. An anterior longitudinal incision was made followed by a standard mid vastus approach. A large effusion was evacuated. The deep fibers of the medial collateral ligament were elevated in a subperiosteal fashion off the medial flare of the tibia so as to maintain a continuous soft tissue sleeve. The patella was subluxed laterally. Severe degenerative changes were noted in tricompartmental fashion. Also of note was prominence of the tibial tubercle consistent with previous history of Osgood-Schlatter disease. Prominent osteophytes were debrided using a rongeur. Anterior and posterior cruciate ligaments were excised using electrocautery. The patella was extremely thickened, and a provisional patella cut was performed so as to better mobilize the extensor mechanism. Two 4.0 mm Schanz pins were inserted into the femur and into the tibia for attachment of the array of trackers used for computer-assisted navigation. Hip center was identified using circumduction technique. Distal landmarks were mapped using the computer. The distal femur and proximal tibia were mapped using the computer. Distal femoral cutting guide was positioned using computer-assisted navigation so as to achieve a 5-degree distal valgus cut. Cut was performed and verified using the computer. Distal femur was sized and it was felt that a size 5 femoral component was appropriate. A size 5 cutting guide was positioned, and the anterior cut was performed and verified using the computer. This was followed by completion of the posterior and chamfer cuts. Extremely large posterior osteophytes were removed along with the posterior cuts. Femoral cutting guide for the central box was then positioned, and the central box cut was performed. Before proceeding to the tibial cut, additional debridement of the  posterior osteophytes was performed so as to  better mobilize the knee. The extramedullary tibial cutting guide was positioned using computer-assisted navigation so as to achieve a 0-degree varus/valgus alignment and 0-degree posterior slope. The cut was performed and verified using the computer. The proximal tibia was sized and it was felt that a size 6 tibial tray was appropriate. Tibial and femoral trials were inserted followed by insertion of first a 10 and subsequently a 12.5 mm polyethylene insert. This allowed for excellent mediolateral soft tissue balancing both in full extension and in flexion. Finally, the patella was recut and prepared so as to accommodate a 38 mm 3-peg oval dome patella. Patellar trial was placed. The knee was placed through a range of motion with excellent patellar tracking appreciated. The femoral trial was removed. Central post hole for the tibial component was reamed followed by insertion of a keel punch. Tibial trials were then removed. The cut surfaces of bone were irrigated with copious amounts of normal saline with antibiotic solution. Polymethylmethacrylate cement was prepared in the usual fashion using a vacuum mixer. Cement with gentamicin was utilized due to the patient's history of diabetes. Cement was applied to the cut surface of the proximal tibia as well as along the undersurface of a size 6 MBT tibial component. The tibial component was positioned and impacted into place. Excess cement was removed using [employer] fast streamer. Cement was then applied to the cut surface of the femur as well as along the posterior flanges of a size 5 posterior stabilized femoral component. Femoral component was positioned and impacted into place. Excess cement was removed using [employer] fast streamer. A 12.5 mm polyethylene trial was inserted, and the knee was brought into full extension using steady axial compression. Finally, cement was applied to the backside of a 38 mm 3-peg oval dome patella, and the patellar component was positioned and  patellar clamp applied. Excess cement was removed using [employer] fast streamer.   After adequate curing of cement, the tourniquet was deflated after a total tourniquet time of 122 minutes. Hemostasis was achieved using electrocautery. The knee was irrigated with copious amounts of normal saline with antibiotic solution using pulsatile lavage. The knee was then inspected for any residual cement debris. Then, 20 mL of 1.3% Exparel and 40 mL of normal saline was injected along the posterior capsule, medial and lateral gutters, and along the arthrotomy site. A 12.5 mm stabilized rotating platform polyethylene insert was inserted, and the knee was placed through a range of motion with excellent patellar tracking appreciated and excellent mediolateral soft tissue balancing noted. Two medium drains were placed in the wound bed and brought out through a separate stab incision to be attached to a reinfusion system. The medial parapatellar portion of the incision was reapproximated using interrupted sutures of #1 Vicryl. The subcutaneous tissue was approximated in layers using first #0 Vicryl followed by #2-0 Vicryl. Then, 30 mL of 0.25% Marcaine with epinephrine was then injected into the subcutaneous tissue along the incision site. The skin was approximated using skin staples. A sterile dressing was applied. The patient tolerated the procedure well. He was transported to the recovery room in stable condition.   ____________________________ Laurice Record. Holley Bouche., MD jph:nb D: 07/05/2014 20:00:27 ET T: 07/06/2014 01:47:33 ET JOB#: [id number]  cc: Laurice Record. Holley Bouche., MD, <Dictator> JAMES P Holley Bouche MD ELECTRONICALLY SIGNED 07/12/2014 20:19

## 2014-10-26 ENCOUNTER — Other Ambulatory Visit: Payer: Self-pay | Admitting: Internal Medicine

## 2014-10-26 ENCOUNTER — Ambulatory Visit
Admission: RE | Admit: 2014-10-26 | Discharge: 2014-10-26 | Disposition: A | Discharge status: Home / Self Care | Payer: Medicare Other | Source: Ambulatory Visit | Attending: Internal Medicine | Admitting: Internal Medicine

## 2014-10-26 DIAGNOSIS — J984 Other disorders of lung: Secondary | ICD-10-CM | POA: Diagnosis not present

## 2014-10-26 DIAGNOSIS — R0602 Shortness of breath: Secondary | ICD-10-CM

## 2014-10-26 DIAGNOSIS — M4800 Spinal stenosis, site unspecified: Secondary | ICD-10-CM | POA: Insufficient documentation

## 2014-10-26 DIAGNOSIS — I2510 Atherosclerotic heart disease of native coronary artery without angina pectoris: Secondary | ICD-10-CM | POA: Diagnosis not present

## 2014-10-26 DIAGNOSIS — R590 Localized enlarged lymph nodes: Secondary | ICD-10-CM | POA: Insufficient documentation

## 2014-10-26 DIAGNOSIS — J90 Pleural effusion, not elsewhere classified: Secondary | ICD-10-CM | POA: Diagnosis not present

## 2014-10-26 DIAGNOSIS — M1280 Other specific arthropathies, not elsewhere classified, unspecified site: Secondary | ICD-10-CM | POA: Diagnosis not present

## 2014-10-26 MED ORDER — IOHEXOL 350 MG/ML SOLN: 100.0000 mL | Freq: Once | INTRAVENOUS | Status: AC | PRN

## 2014-10-31 ENCOUNTER — Encounter: Payer: Self-pay | Admitting: Emergency Medicine

## 2014-10-31 ENCOUNTER — Inpatient Hospital Stay
Admission: EM | Admit: 2014-10-31 | Discharge: 2014-11-03 | DRG: 291 | Disposition: A | Discharge status: Home Health | Payer: Medicare Other | Attending: Internal Medicine | Admitting: Internal Medicine

## 2014-10-31 ENCOUNTER — Emergency Department: Payer: Medicare Other

## 2014-10-31 DIAGNOSIS — I2510 Atherosclerotic heart disease of native coronary artery without angina pectoris: Secondary | ICD-10-CM | POA: Diagnosis present

## 2014-10-31 DIAGNOSIS — I4891 Unspecified atrial fibrillation: Secondary | ICD-10-CM | POA: Diagnosis present

## 2014-10-31 DIAGNOSIS — D638 Anemia in other chronic diseases classified elsewhere: Secondary | ICD-10-CM | POA: Diagnosis present

## 2014-10-31 DIAGNOSIS — R7989 Other specified abnormal findings of blood chemistry: Secondary | ICD-10-CM | POA: Diagnosis present

## 2014-10-31 DIAGNOSIS — M4804 Spinal stenosis, thoracic region: Secondary | ICD-10-CM | POA: Diagnosis present

## 2014-10-31 DIAGNOSIS — Z7982 Long term (current) use of aspirin: Secondary | ICD-10-CM

## 2014-10-31 DIAGNOSIS — R778 Other specified abnormalities of plasma proteins: Secondary | ICD-10-CM | POA: Diagnosis present

## 2014-10-31 DIAGNOSIS — I5043 Acute on chronic combined systolic (congestive) and diastolic (congestive) heart failure: Secondary | ICD-10-CM | POA: Diagnosis not present

## 2014-10-31 DIAGNOSIS — I509 Heart failure, unspecified: Secondary | ICD-10-CM

## 2014-10-31 DIAGNOSIS — I10 Essential (primary) hypertension: Secondary | ICD-10-CM | POA: Diagnosis present

## 2014-10-31 DIAGNOSIS — I248 Other forms of acute ischemic heart disease: Secondary | ICD-10-CM | POA: Diagnosis present

## 2014-10-31 DIAGNOSIS — J189 Pneumonia, unspecified organism: Secondary | ICD-10-CM | POA: Diagnosis present

## 2014-10-31 DIAGNOSIS — R0902 Hypoxemia: Secondary | ICD-10-CM | POA: Diagnosis present

## 2014-10-31 DIAGNOSIS — I252 Old myocardial infarction: Secondary | ICD-10-CM

## 2014-10-31 DIAGNOSIS — E871 Hypo-osmolality and hyponatremia: Secondary | ICD-10-CM | POA: Diagnosis present

## 2014-10-31 DIAGNOSIS — K219 Gastro-esophageal reflux disease without esophagitis: Secondary | ICD-10-CM | POA: Diagnosis present

## 2014-10-31 DIAGNOSIS — R0602 Shortness of breath: Secondary | ICD-10-CM

## 2014-10-31 DIAGNOSIS — Z87891 Personal history of nicotine dependence: Secondary | ICD-10-CM | POA: Diagnosis not present

## 2014-10-31 DIAGNOSIS — J810 Acute pulmonary edema: Secondary | ICD-10-CM

## 2014-10-31 DIAGNOSIS — R609 Edema, unspecified: Secondary | ICD-10-CM

## 2014-10-31 HISTORY — DX: Heart failure, unspecified: I50.9

## 2014-10-31 LAB — COMPREHENSIVE METABOLIC PANEL
ALT: 14 U/L — ABNORMAL LOW (ref 17–63)
AST: 25 U/L (ref 15–41)
Albumin: 2.8 g/dL — ABNORMAL LOW (ref 3.5–5.0)
Alkaline Phosphatase: 62 U/L (ref 38–126)
Anion gap: 8 (ref 5–15)
BUN: 18 mg/dL (ref 6–20)
CO2: 27 mmol/L (ref 22–32)
CREATININE: 0.82 mg/dL (ref 0.61–1.24)
Calcium: 8.2 mg/dL — ABNORMAL LOW (ref 8.9–10.3)
Chloride: 89 mmol/L — ABNORMAL LOW (ref 101–111)
GFR calc Af Amer: >60 mL/min (ref >60)
GLUCOSE: 185 mg/dL — AB (ref 65–99)
POTASSIUM: 3.8 mmol/L (ref 3.5–5.1)
Sodium: 124 mmol/L — ABNORMAL LOW (ref 135–145)
TOTAL PROTEIN: 6.6 g/dL (ref 6.5–8.1)
Total Bilirubin: 0.5 mg/dL (ref 0.3–1.2)

## 2014-10-31 LAB — CBC WITH DIFFERENTIAL/PLATELET
BASOS ABS: 0.1 10*3/uL (ref 0–0.1)
Basophils Relative: 1 %
Eosinophils Absolute: 0.2 10*3/uL (ref 0–0.7)
Eosinophils Relative: 4 %
HCT: 32.6 % — ABNORMAL LOW (ref 40.0–52.0)
Hemoglobin: 10.8 g/dL — ABNORMAL LOW (ref 13.0–18.0)
LYMPHS PCT: 13 %
Lymphs Abs: 0.9 10*3/uL — ABNORMAL LOW (ref 1.0–3.6)
MCH: 26.4 pg (ref 26.0–34.0)
MCHC: 33.1 g/dL (ref 32.0–36.0)
MCV: 79.7 fL — AB (ref 80.0–100.0)
MONOS PCT: 13 %
Monocytes Absolute: 0.9 10*3/uL (ref 0.2–1.0)
NEUTROS ABS: 4.8 10*3/uL (ref 1.4–6.5)
NEUTROS PCT: 69 %
Platelets: 238 10*3/uL (ref 150–440)
RBC: 4.09 MIL/uL — ABNORMAL LOW (ref 4.40–5.90)
RDW: 16.4 % — ABNORMAL HIGH (ref 11.5–14.5)
WBC: 7 10*3/uL (ref 3.8–10.6)

## 2014-10-31 LAB — TROPONIN I
TROPONIN I: 0.24 ng/mL — AB (ref <0.031)
Troponin I: 0.28 ng/mL — ABNORMAL HIGH (ref <0.031)

## 2014-10-31 LAB — GLUCOSE, CAPILLARY: GLUCOSE-CAPILLARY: 191 mg/dL — AB (ref 65–99)

## 2014-10-31 LAB — BRAIN NATRIURETIC PEPTIDE: B NATRIURETIC PEPTIDE 5: 1747 pg/mL — AB (ref 0.0–100.0)

## 2014-10-31 MED ORDER — ASPIRIN EC 81 MG PO TBEC
81.0000 mg | DELAYED_RELEASE_TABLET | Freq: Every day | ORAL | Status: DC
  Administered 2014-11-01 – 2014-11-03 (×3): 81 mg via ORAL
  Filled 2014-10-31 (×4): qty 1

## 2014-10-31 MED ORDER — POTASSIUM CHLORIDE 20 MEQ PO PACK
20.0000 meq | PACK | Freq: Every day | ORAL | Status: DC
  Administered 2014-10-31 – 2014-11-01 (×2): 20 meq via ORAL
  Filled 2014-10-31: qty 1

## 2014-10-31 MED ORDER — DOCUSATE SODIUM 100 MG PO CAPS
100.0000 mg | ORAL_CAPSULE | Freq: Two times a day (BID) | ORAL | Status: DC
  Administered 2014-10-31 – 2014-11-03 (×6): 100 mg via ORAL
  Filled 2014-10-31 (×6): qty 1

## 2014-10-31 MED ORDER — TIMOLOL MALEATE 0.5 % OP SOLN
1.0000 [drp] | Freq: Every day | OPHTHALMIC | Status: DC
  Administered 2014-11-01: 1 [drp] via OPHTHALMIC
  Filled 2014-10-31: qty 5

## 2014-10-31 MED ORDER — METOPROLOL SUCCINATE ER 25 MG PO TB24
25.0000 mg | ORAL_TABLET | Freq: Every day | ORAL | Status: DC
  Administered 2014-11-01 – 2014-11-03 (×3): 25 mg via ORAL
  Filled 2014-10-31 (×3): qty 1

## 2014-10-31 MED ORDER — ACETAMINOPHEN 650 MG RE SUPP: 650.0000 mg | Freq: Four times a day (QID) | RECTAL | Status: DC | PRN

## 2014-10-31 MED ORDER — APIXABAN 5 MG PO TABS
5.0000 mg | ORAL_TABLET | Freq: Two times a day (BID) | ORAL | Status: DC
  Administered 2014-10-31: 5 mg via ORAL
  Filled 2014-10-31: qty 1

## 2014-10-31 MED ORDER — MORPHINE SULFATE 2 MG/ML IJ SOLN: 2.0000 mg | INTRAMUSCULAR | Status: DC | PRN

## 2014-10-31 MED ORDER — FUROSEMIDE 10 MG/ML IJ SOLN
INTRAMUSCULAR | Status: AC
  Administered 2014-10-31: 40 mg via INTRAVENOUS
  Filled 2014-10-31: qty 4

## 2014-10-31 MED ORDER — ONDANSETRON HCL 4 MG/2ML IJ SOLN: 4.0000 mg | Freq: Four times a day (QID) | INTRAMUSCULAR | Status: DC | PRN

## 2014-10-31 MED ORDER — FUROSEMIDE 10 MG/ML IJ SOLN
40.0000 mg | Freq: Two times a day (BID) | INTRAMUSCULAR | Status: DC
  Administered 2014-10-31 – 2014-11-02 (×3): 40 mg via INTRAVENOUS
  Filled 2014-10-31 (×2): qty 4

## 2014-10-31 MED ORDER — POTASSIUM CHLORIDE 20 MEQ PO PACK
PACK | ORAL | Status: AC
  Administered 2014-10-31: 20 meq via ORAL
  Filled 2014-10-31: qty 1

## 2014-10-31 MED ORDER — INSULIN ASPART 100 UNIT/ML ~~LOC~~ SOLN: 0.0000 [IU] | Freq: Every day | SUBCUTANEOUS | Status: DC

## 2014-10-31 MED ORDER — SODIUM CHLORIDE 0.9 % IJ SOLN
3.0000 mL | Freq: Two times a day (BID) | INTRAMUSCULAR | Status: DC
  Administered 2014-11-01 – 2014-11-02 (×3): 3 mL via INTRAVENOUS

## 2014-10-31 MED ORDER — IPRATROPIUM-ALBUTEROL 0.5-2.5 (3) MG/3ML IN SOLN
RESPIRATORY_TRACT | Status: AC
  Administered 2014-10-31: 3 mL via RESPIRATORY_TRACT
  Filled 2014-10-31: qty 3

## 2014-10-31 MED ORDER — INSULIN ASPART 100 UNIT/ML ~~LOC~~ SOLN
0.0000 [IU] | Freq: Three times a day (TID) | SUBCUTANEOUS | Status: DC
  Administered 2014-11-01: 1 [IU] via SUBCUTANEOUS
  Administered 2014-11-01: 2 [IU] via SUBCUTANEOUS
  Filled 2014-10-31: qty 2
  Filled 2014-10-31: qty 1

## 2014-10-31 MED ORDER — ADULT MULTIVITAMIN W/MINERALS CH
1.0000 | ORAL_TABLET | Freq: Every day | ORAL | Status: DC
  Administered 2014-11-01 – 2014-11-03 (×3): 1 via ORAL
  Filled 2014-10-31 (×4): qty 1

## 2014-10-31 MED ORDER — ACETAMINOPHEN 325 MG PO TABS: 650.0000 mg | ORAL_TABLET | Freq: Four times a day (QID) | ORAL | Status: DC | PRN

## 2014-10-31 MED ORDER — PIPERACILLIN-TAZOBACTAM 3.375 G IVPB
3.3750 g | Freq: Three times a day (TID) | INTRAVENOUS | Status: DC
  Administered 2014-10-31 – 2014-11-03 (×9): 3.375 g via INTRAVENOUS
  Filled 2014-10-31 (×9): qty 50

## 2014-10-31 MED ORDER — PIPERACILLIN-TAZOBACTAM 3.375 G IVPB
INTRAVENOUS | Status: AC
  Administered 2014-10-31: 3.375 g via INTRAVENOUS
  Filled 2014-10-31: qty 50

## 2014-10-31 MED ORDER — BISACODYL 10 MG RE SUPP: 10.0000 mg | Freq: Every day | RECTAL | Status: DC | PRN

## 2014-10-31 MED ORDER — ONDANSETRON HCL 4 MG PO TABS: 4.0000 mg | ORAL_TABLET | Freq: Four times a day (QID) | ORAL | Status: DC | PRN

## 2014-10-31 MED ORDER — GLIPIZIDE ER 10 MG PO TB24
10.0000 mg | ORAL_TABLET | Freq: Every day | ORAL | Status: DC
  Administered 2014-11-01 – 2014-11-03 (×3): 10 mg via ORAL
  Filled 2014-10-31 (×4): qty 1

## 2014-10-31 MED ORDER — IPRATROPIUM-ALBUTEROL 0.5-2.5 (3) MG/3ML IN SOLN
3.0000 mL | Freq: Four times a day (QID) | RESPIRATORY_TRACT | Status: DC
  Administered 2014-10-31 – 2014-11-02 (×8): 3 mL via RESPIRATORY_TRACT
  Filled 2014-10-31 (×8): qty 3

## 2014-10-31 MED ORDER — PANTOPRAZOLE SODIUM 40 MG PO TBEC
40.0000 mg | DELAYED_RELEASE_TABLET | Freq: Every day | ORAL | Status: DC
  Administered 2014-11-01 – 2014-11-03 (×3): 40 mg via ORAL
  Filled 2014-10-31 (×4): qty 1

## 2014-10-31 MED ORDER — MONTELUKAST SODIUM 10 MG PO TABS
10.0000 mg | ORAL_TABLET | Freq: Every day | ORAL | Status: DC
  Administered 2014-11-01 – 2014-11-02 (×2): 10 mg via ORAL
  Filled 2014-10-31 (×2): qty 1

## 2014-10-31 NOTE — ED Notes (Signed)
Zosyn continues to infuse at 12.55ml/hr via lac int. Infusion stopped by mistake in computer prior to transport to floor.

## 2014-10-31 NOTE — ED Provider Notes (Signed)
[facility] Emergency Department Provider Note  ____________________________________________  Time seen: On arrival to the emergency department  I have reviewed the triage vital signs and the nursing notes.   HISTORY  Chief Complaint Shortness of Breath and Leg Swelling    HPI [name] is a 81 y.o. male with a history of hypertension, A. fib and congestive heart failure who presents today with worsening shortness of breath over the past week. He was seen in his primary care doctor's office on May 31 and was prescribed Levaquin. He also just started apixaban yesterday for his atrial fibrillation. He denies any chest pain. Denies any cough or fever. He also notes that he has had increased leg swelling over since the 31st that is bilateral. However, the left leg is swollen greater than the right. He has been wearing support hose because of this.   Past Medical History  Diagnosis Date  . Diabetes mellitus without complication   . Hypertension   . Reflux     There are no active problems to display for this patient.   History reviewed. No pertinent past surgical history.  Current Outpatient Rx  Name  Route  Sig  Dispense  Refill  . Cetirizine HCl (ZYRTEC PO)   Oral   Take by mouth.         Marland Kitchen GLIPIZIDE XL 10 MG 24 hr tablet               . montelukast (SINGULAIR) 10 MG tablet               . omeprazole (PRILOSEC) 20 MG capsule               . quinapril-hydrochlorothiazide (ACCURETIC) 20-12.5 MG per tablet               . timolol (TIMOPTIC) 0.5 % ophthalmic solution               . TOPROL XL 25 MG 24 hr tablet                 Allergies Cefdinir; Cephalexin; Codeine; and Doxycycline  History reviewed. No pertinent family history.  Social History History  Substance Use Topics  . Smoking status: Never Smoker   . Smokeless tobacco: Never Used  . Alcohol Use: Yes    Review of Systems Constitutional: No  fever/chills Eyes: No visual changes. ENT: No sore throat. Cardiovascular: Denies chest pain. Respiratory: Shortness of breath Gastrointestinal: No abdominal pain.  No nausea, no vomiting.  No diarrhea.  No constipation. Genitourinary: Negative for dysuria. Musculoskeletal: Negative for back pain. Skin: Negative for rash. Neurological: Negative for headaches, focal weakness or numbness.  10-point ROS otherwise negative.  ____________________________________________   PHYSICAL EXAM:  VITAL SIGNS: ED Triage Vitals  Enc Vitals Group     BP 10/31/14 1607 92/58 mmHg     Pulse Rate 10/31/14 1607 72     Resp --      Temp 10/31/14 1607 98 F (36.7 C)     Temp Source 10/31/14 1607 Oral     SpO2 10/31/14 1607 98 %     Weight 10/31/14 1607 220 lb (99.791 kg)     Height 10/31/14 1607 6\' 4"  (1.93 m)     Head Cir --      Peak Flow --      Pain Score --      Pain Loc --      Pain Edu? --  Excl. in [place]? --     Constitutional: Alert and oriented. Well appearing and in no acute distress. Eyes: Conjunctivae are normal. PERRL. EOMI. Head: Atraumatic. Nose: No congestion/rhinnorhea. Mouth/Throat: Mucous membranes are moist.  Oropharynx non-erythematous. Neck: No stridor.   Cardiovascular: Normal rate, regular rhythm. Grossly normal heart sounds.  Good peripheral circulation. Respiratory: Intermittently labored respirations. Rales to the left mid field and decreased respirations the right lower field.  Gastrointestinal: Soft and nontender. No distention. No abdominal bruits. No CVA tenderness. Musculoskeletal: No lower extremity tenderness. Moderate to severe edema to the bilateral lower extremities with the left greater than the right.  No joint effusions. Neurologic:  Normal speech and language. No gross focal neurologic deficits are appreciated. Speech is normal. No gait instability. Skin:  Skin is warm, dry and intact. No rash noted. Psychiatric: Mood and affect are normal. Speech  and behavior are normal.  ____________________________________________   LABS (all labs ordered are listed, but only abnormal results are displayed)  Labs Reviewed  CBC WITH DIFFERENTIAL/PLATELET - Abnormal; Notable for the following:    RBC 4.09 (*)    Hemoglobin 10.8 (*)    HCT 32.6 (*)    MCV 79.7 (*)    RDW 16.4 (*)    Lymphs Abs 0.9 (*)    All other components within normal limits  COMPREHENSIVE METABOLIC PANEL - Abnormal; Notable for the following:    Sodium 124 (*)    Chloride 89 (*)    Glucose, Bld 185 (*)    Calcium 8.2 (*)    Albumin 2.8 (*)    ALT 14 (*)    All other components within normal limits  TROPONIN I - Abnormal; Notable for the following:    Troponin I 0.28 (*)    All other components within normal limits  BRAIN NATRIURETIC PEPTIDE - Abnormal; Notable for the following:    B Natriuretic Peptide 1747.0 (*)    All other components within normal limits   ____________________________________________  EKG  ED ECG REPORT I, Doran Stabler, the attending physician, personally viewed and interpreted this ECG.   Date: 10/31/2014  EKG Time: 1528  Rate: 62  Rhythm: Atrial fibrillation, rate controlled  Axis: Normal axis  Intervals:none  ST&T Change: T wave inversions in V2 through V4. These are new since 06/22/2014 from last EKG on file.  ____________________________________________  RADIOLOGY  Worsening bilateral airspace opacity suspicious for pneumonia. Underlying pleural effusions appear the same. ____________________________________________   PROCEDURES    ____________________________________________   INITIAL IMPRESSION / ASSESSMENT AND PLAN / ED COURSE  Pertinent labs & imaging results that were available during my care of the patient were reviewed by me and considered in my medical decision making (see chart for details).  ----------------------------------------- 4:59 PM on  10/31/2014 -----------------------------------------  Patient with reassuring oxygen saturation. Resting comfortably. Discussed case with Dr. Doy Hutching who will see the patient. Still pending his lites. Also with CAT scan of the chest on May 31 without pulmonary embolus. Dr. Doy Hutching unsure if pneumonia since no fever or cough. Holding antibiotics for the time being.  ----------------------------------------- 5:31 PM on 10/31/2014 -----------------------------------------  Patient with elevated troponin. Also borderline hypotensive with hyponatremia. Feel that the presentation may be cardiac related with worsening bilateral pulmonary edema. Less likely pneumonia because no white count, fever or cough. Already on eliquis and started yesterday. Will not give further anticoagulation at this time. To be admitted to Dr. Doy Hutching. Patient asked to continue to deny chest pain. ____________________________________________   FINAL  CLINICAL IMPRESSION(S) / ED DIAGNOSES  Acute dyspnea. Acute CHF. Acute peripheral edema.    Orbie Pyo, MD 10/31/14 [phone]

## 2014-10-31 NOTE — Progress Notes (Signed)
Pt refusing the following meds. timolo ophthalmic, multivitamin w/mineralsprotonix, and aspirin.

## 2014-10-31 NOTE — ED Notes (Signed)
Patient arrives from home with c/o shortness of breath and increasing pedal edema. States he was diagnosed with Afib and possibly pneumonia. Patient was started on eliquis but not on an antibiotic. 86% on RA at home per EMS, and 80/50. Patient also c/o decreased PO intake with a temp of 101, 2 days ago

## 2014-10-31 NOTE — H&P (Signed)
History and Physical    [name] PXT:[id number] DOB: [birth year] DOA: 10/31/2014  Referring physician: Dr. Clearnce Hasten  PCP: Idelle Crouch, MD  Specialists: Dr. Saralyn Pilar  Chief Complaint: SOB  HPI: [name] is a 81 y.o. male has a past medical history significant for A-fib, CHF, and recent CAP on Levaquin who presents to ER with worsening SOB and DOE as well as worsening pedal edema. Denies CP. In ER, patient noted to be anemic and hyponatremic with elevated BNP and troponin. CXR shows worsening CHF. He is now admitted for further evaluation.  Review of Systems: The patient denies anorexia, fever, weight loss,, vision loss, decreased hearing, hoarseness, chest pain, syncope, balance deficits, hemoptysis, abdominal pain, melena, hematochezia, severe indigestion/heartburn, hematuria, incontinence, genital sores, muscle weakness, suspicious skin lesions, transient blindness, difficulty walking, depression, unusual weight change, abnormal bleeding, enlarged lymph nodes, angioedema, and breast masses.   Past Medical History  Diagnosis Date  . Diabetes mellitus without complication   . Hypertension   . Reflux    History reviewed. No pertinent past surgical history. Social History:  reports that he has never smoked. He has never used smokeless tobacco. He reports that he drinks alcohol. He reports that he does not use illicit drugs.  Allergies  Allergen Reactions  . Cefdinir Diarrhea  . Cephalexin Diarrhea  . Codeine Nausea Only  . Doxycycline Nausea And Vomiting  . Tape Rash    Red rash     History reviewed. No pertinent family history.  Prior to Admission medications   Medication Sig Start Date End Date Taking? Authorizing Provider  Cetirizine HCl (ZYRTEC PO) Take by mouth.    Historical Provider, MD  GLIPIZIDE XL 10 MG 24 hr tablet  04/29/13   Historical Provider, MD  montelukast (SINGULAIR) 10 MG tablet  04/30/13   Historical Provider, MD  omeprazole (PRILOSEC) 20  MG capsule  05/01/13   Historical Provider, MD  quinapril-hydrochlorothiazide (ACCURETIC) 20-12.5 MG per tablet  04/29/13   Historical Provider, MD  timolol (TIMOPTIC) 0.5 % ophthalmic solution  04/03/13   Historical Provider, MD  TOPROL XL 25 MG 24 hr tablet  04/28/13   Historical Provider, MD   Physical Exam: Filed Vitals:   10/31/14 1607  BP: 92/58  Pulse: 72  Temp: 98 F (36.7 C)  TempSrc: Oral  Height: 6\' 4"  (1.93 m)  Weight: 99.791 kg (220 lb)  SpO2: 98%     General:  No apparent distress  Eyes: PERRL, EOMI, no scleral icterus  ENT: moist oropharynx  Neck: supple, no lymphadenopathy  Cardiovascular: irregularly irregualr without MRG; 2+ peripheral pulses, no JVD, 2+ peripheral edema noted  Respiratory: basilar rales noted without wheezes or rhonchi. Dullness noted at the bases. Resp. Effort normal  Abdomen: soft, non tender to palpation, positive bowel sounds, no guarding, no rebound  Skin: no rashes  Musculoskeletal: normal bulk and tone, no joint swelling  Psychiatric: normal mood and affect  Neurologic: CN 2-12 grossly intact, MS 5/5 in all 4  Labs on Admission:  Basic Metabolic Panel:  Recent Labs Lab 10/31/14 1546  NA 124*  K 3.8  CL 89*  CO2 27  GLUCOSE 185*  BUN 18  CREATININE 0.82  CALCIUM 8.2*   Liver Function Tests:  Recent Labs Lab 10/31/14 1546  AST 25  ALT 14*  ALKPHOS 62  BILITOT 0.5  PROT 6.6  ALBUMIN 2.8*   No results for input(s): LIPASE, AMYLASE in the last 168 hours. No results for input(s): AMMONIA in  the last 168 hours. CBC:  Recent Labs Lab 10/31/14 1546  WBC 7.0  NEUTROABS 4.8  HGB 10.8*  HCT 32.6*  MCV 79.7*  PLT 238   Cardiac Enzymes:  Recent Labs Lab 10/31/14 1546  TROPONINI 0.28*    BNP (last 3 results)  Recent Labs  10/31/14 1544  BNP 1747.0*    ProBNP (last 3 results) No results for input(s): PROBNP in the last 8760 hours.  CBG: No results for input(s): GLUCAP in the last 168  hours.  Radiological Exams on Admission: Dg Chest 2 View  10/31/2014   CLINICAL DATA:  Shortness of breath with increasing pedal edema. Atrial fibrillation and possible pneumonia.  EXAM: CHEST  2 VIEW  COMPARISON:  CT 10/26/2014.  Radiographs 10/14/2014 unavailable.  FINDINGS: The posterior chest is partially excluded from the lateral view. Moderate-sized bilateral pleural effusions appear unchanged. There are worsening bilateral perihilar airspace opacities compared with the scout image from the prior CT. The pulmonary vasculature appears fairly well-defined. The heart size and mediastinal contours are stable. No acute osseous abnormalities identified. There are degenerative changes throughout the thoracic spine with a wedge deformity in the mid thoracic spine, not well seen on axial images from prior CT. This does not have an acute appearance.  IMPRESSION: Worsening bilateral airspace opacities suspicious for pneumonia. Underlying pleural effusions appear about the same.   Electronically Signed   By: Richardean Sale M.D.   On: 10/31/2014 16:24    EKG: Independently reviewed.  Assessment/Plan Principal Problem:   Acute on chronic systolic and diastolic heart failure, NYHA class 3 Active Problems:   CAP (community acquired pneumonia)   Atrial fibrillation   Hyponatremia   Anemia, chronic disease   Elevated troponin   Will admit to telemetry and begin IV Lasix and SVN's. Will change to IV ABX for CAP. Follow enzymes. Follow sugars and sodium. Order echo. Consult Cardiology, PT, and CSW.  Diet: 2g Na+, ADA  Fluids: none DVT Prophylaxis: Eliquis  Code Status: FULL  Family Communication: yes  Disposition Plan: SNF  Time spent: 55 min

## 2014-11-01 ENCOUNTER — Inpatient Hospital Stay
Admit: 2014-11-01 | Discharge: 2014-11-01 | Disposition: A | Discharge status: Home / Self Care | Payer: Medicare Other | Attending: Internal Medicine | Admitting: Internal Medicine

## 2014-11-01 ENCOUNTER — Inpatient Hospital Stay: Payer: Medicare Other

## 2014-11-01 LAB — TROPONIN I
TROPONIN I: 0.24 ng/mL — AB (ref <0.031)
TROPONIN I: 0.19 ng/mL — AB (ref <0.031)

## 2014-11-01 LAB — COMPREHENSIVE METABOLIC PANEL
ALT: 14 U/L — AB (ref 17–63)
ANION GAP: 9 (ref 5–15)
AST: 24 U/L (ref 15–41)
Albumin: 2.8 g/dL — ABNORMAL LOW (ref 3.5–5.0)
Alkaline Phosphatase: 58 U/L (ref 38–126)
BUN: 15 mg/dL (ref 6–20)
CO2: 26 mmol/L (ref 22–32)
Calcium: 7.9 mg/dL — ABNORMAL LOW (ref 8.9–10.3)
Chloride: 87 mmol/L — ABNORMAL LOW (ref 101–111)
Creatinine, Ser: 0.78 mg/dL (ref 0.61–1.24)
GFR calc Af Amer: >60 mL/min (ref >60)
GFR calc non Af Amer: >60 mL/min (ref >60)
GLUCOSE: 167 mg/dL — AB (ref 65–99)
Potassium: 3.4 mmol/L — ABNORMAL LOW (ref 3.5–5.1)
SODIUM: 122 mmol/L — AB (ref 135–145)
TOTAL PROTEIN: 6.4 g/dL — AB (ref 6.5–8.1)
Total Bilirubin: 0.7 mg/dL (ref 0.3–1.2)

## 2014-11-01 LAB — CBC
HCT: 31 % — ABNORMAL LOW (ref 40.0–52.0)
HEMOGLOBIN: 10.4 g/dL — AB (ref 13.0–18.0)
MCH: 26.6 pg (ref 26.0–34.0)
MCHC: 33.6 g/dL (ref 32.0–36.0)
MCV: 79.1 fL — ABNORMAL LOW (ref 80.0–100.0)
PLATELETS: 213 10*3/uL (ref 150–440)
RBC: 3.92 MIL/uL — ABNORMAL LOW (ref 4.40–5.90)
RDW: 16.4 % — ABNORMAL HIGH (ref 11.5–14.5)
WBC: 6.6 10*3/uL (ref 3.8–10.6)

## 2014-11-01 LAB — GLUCOSE, CAPILLARY
GLUCOSE-CAPILLARY: 172 mg/dL — AB (ref 65–99)
GLUCOSE-CAPILLARY: 85 mg/dL (ref 65–99)
Glucose-Capillary: 150 mg/dL — ABNORMAL HIGH (ref 65–99)
Glucose-Capillary: 88 mg/dL (ref 65–99)

## 2014-11-01 LAB — PROTIME-INR
INR: 1.36
Prothrombin Time: 17 seconds — ABNORMAL HIGH (ref 11.4–15.0)

## 2014-11-01 NOTE — Progress Notes (Signed)
Called MD, questioning about schedule Med. Lasix. Dr. Arville Go aware of Pt Dx of CHF. Pt's BP still low 95/58. Order received to hold Lasix for now.

## 2014-11-01 NOTE — Progress Notes (Addendum)
   11/01/14 1600  Clinical Encounter Type  Visited With Patient  Visit Type Initial  Spiritual Encounters  Spiritual Needs Prayer;Other (Comment)  Stress Factors  Patient Stress Factors None identified    Faith Tradition: Catholic Status: coping with congestive heart failure Family Present: none but wife is very supportive Assessment: Chaplain introduced pastoral care 24x7 to the patient and offered to facilitate priest access. The patient was glad to know that a priest could be arranged to visit but did not make a request at this time. 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 He and the chaplain conversed just before he ate dinner and he will be lifted up in prayer.  For any Pastoral Care needs, the chaplains can be reached daily (24x7) by submitting a referral or via on-call pager, [phone]

## 2014-11-01 NOTE — Progress Notes (Signed)
[name] is a 81 y.o. male  Acute on chronic systolic and diastolic heart failure, NYHA class 3   SUBJECTIVE:  Pt in NAD. C/o worsening SOB. Did not receive last dose of Lasix due to hypotension. Denies CP.  ______________________________________________________________________  ROS: Review of systems is unremarkable for any active cardiac,respiratory, GI, GU, hematologic, neurologic or psychiatric systems, 10 systems reviewed.  @CMEDLIST @  Past Medical History  Diagnosis Date  . Diabetes mellitus without complication   . Hypertension   . Reflux   . CHF (congestive heart failure), NYHA class III 10/31/2014    History reviewed. No pertinent past surgical history.  PHYSICAL EXAM:  BP 95/58 mmHg  Pulse 77  Temp(Src) 97.7 F (36.5 C) (Oral)  Resp 23  Ht 6\' 4"  (1.93 m)  Wt 97.16 kg (214 lb 3.2 oz)  BMI 26.08 kg/m2  SpO2 100%  Wt Readings from Last 3 Encounters:  11/01/14 97.16 kg (214 lb 3.2 oz)  08/05/13 113.399 kg (250 lb)  05/06/13 113.399 kg (250 lb)            Constitutional: NAD Neck: supple, no thyromegaly Respiratory: basilar rales, no wheezes Cardiovascular:irregularly irregular, no murmur, no gallop Abdomen: soft, good BS, nontender Extremities: 1+ edema Neuro: alert and oriented, no focal motor or sensory deficits  ASSESSMENT/PLAN:  Labs and imaging studies were reviewed  Will continue IV Lasix. Labs and repeat CXR pending. Wean O2. Echo and Cardiology consult today. PT eval. CSW consult for placement.

## 2014-11-01 NOTE — Care Management (Signed)
Informed by physical therapy patient declined any home health and therefore recommend outpatient physical therapy and a rolling walker.  Spoke with patient and his wife.  Patient says he can not focus on discussion about discharge plans "without knowing what is wrong.  They do not know what is wrong with me yet."  Wife is interested in a service that "will get the patient into the house" because she herself has functional limitations and not able to assist.    Further discussion reveals that this is not a new problem but will need a plan for getting the patient transportation assist home.  Discussed possibility of a wheelchair at discharge and options for medicare transportation through [place].  Referral to CSW for transportation options.   Patient would have to navigate 6 stairs to get into the house.  Has had difficulty doing this for "a while."  Patient and wife do not want to consider skilled facility placement.   Patient says he will will accept home health nursing and physical therapy.  Discussed need to assess room air exertional sats.  Physical therapy will need to assess patient's ability to navigate stairs

## 2014-11-01 NOTE — Consult Note (Signed)
[facility] Consultation Note  Patient ID: [name], MRN: [id number], DOB/AGE: 1932-02-18 81 y.o. Admit date: 10/31/2014   Date of Consult: 11/01/2014 Primary Physician: Idelle Crouch, MD Primary Cardiologist: PARASCHOS  Chief Complaint:  Chief Complaint  Patient presents with  . Shortness of Breath  . Leg Swelling   Reason for Consult: acute on chronic systolic dysfunction congestive heart failure with elevated troponin  HPI: 81 y.o. male with known coronary artery disease status post multiple myocardial infarctions with a cardiac catheterization in 2012 showing occluded right coronary artery significant distal LAD stenosis and 50% stenosis of right circumflex artery. The patient also has had known LV systolic dysfunction with ejection fraction of 40% who is had recent anemia. He additionally has had paroxysmal nonvalvular atrial fibrillation with controlled ventricular rate on appropriate medication management who is had some mild amount of anemia without evidence of significant bleeding. Recently he has had pneumonia type symptoms and bronchitis but worsening shortness of breath with physical activity and relieved by rest progressive in nature consistent with anginal equivalent. Recent stress test in 2015 did show fixed inferior and apical defect although there may be further myocardial ischemia since that period of time. The patient also has had appropriate medication management for further risk reduction in cardiovascular event including metoprolol. The patient now is admitted to the hospital with significant shortness of breath elevated BNP elevated troponin of 0.24 most consistent with demand ischemia rather than acute coronary syndrome, although may be secondary to some myocardial ischemia as well. EKG currently shows trivial fibrillation with controlled ventricular rate and preventricular contractions  Past Medical History  Diagnosis Date  . Diabetes mellitus  without complication   . Hypertension   . Reflux   . CHF (congestive heart failure), NYHA class III 10/31/2014      Surgical History: History reviewed. No pertinent past surgical history.   Home Meds: Prior to Admission medications   Medication Sig Start Date End Date Taking? Authorizing Provider  aspirin EC 325 MG tablet Take 325 mg by mouth daily. 06/02/12  Yes Historical Provider, MD  B Complex Vitamins (VITAMIN B COMPLEX) TABS Take 1 tablet by mouth daily. 06/02/12  Yes Historical Provider, MD  Cholecalciferol (VITAMIN D3) 2000 UNITS capsule Take 2,000 Units by mouth daily.   Yes Historical Provider, MD  ELIQUIS 5 MG TABS tablet Take 5 mg by mouth daily. 10/27/14  Yes Historical Provider, MD  fluticasone (FLONASE) 50 MCG/ACT nasal spray Place 2 puffs into the nose daily. 07/30/11  Yes Historical Provider, MD  glucose blood test strip Apply 1 each topically 2 (two) times daily. 08/23/14  Yes Historical Provider, MD  ipratropium (ATROVENT) 0.03 % nasal spray Place 2 sprays into the nose 3 (three) times daily as needed. 08/20/14  Yes Historical Provider, MD  levofloxacin (LEVAQUIN) 500 MG tablet Take 500 mg by mouth daily. 10/26/14 11/05/14 Yes Historical Provider, MD  montelukast (SINGULAIR) 10 MG tablet Take 10 mg by mouth daily.  04/30/13  Yes Historical Provider, MD  Multiple Vitamins-Minerals (CENTRUM SILVER) tablet Take 1 tablet by mouth daily. 06/02/12  Yes Historical Provider, MD  Omega-3 Fatty Acids (EQL OMEGA 3 FISH OIL) 1200 MG CAPS Take 1,200 mg by mouth daily. 06/02/12  Yes Historical Provider, MD  omeprazole (PRILOSEC) 40 MG capsule Take 40 mg by mouth daily. 12/15/13  Yes Historical Provider, MD  quinapril-hydrochlorothiazide (ACCURETIC) 20-12.5 MG per tablet TAKE 1 TABLET ONCE A DAY 06/28/14  Yes Historical Provider, MD  simvastatin (ZOCOR) 20  MG tablet Take 20 mg by mouth at bedtime. 08/19/14 08/19/15 Yes Historical Provider, MD  SYMBICORT 160-4.5 MCG/ACT inhaler Inhale 2 puffs into the lungs 2  (two) times daily. 10/10/14  Yes Historical Provider, MD  TOPROL XL 25 MG 24 hr tablet Take 25 mg by mouth daily.  04/28/13  Yes Historical Provider, MD  vitamin E 400 UNIT capsule Take 400 Units by mouth daily.   Yes Historical Provider, MD    Inpatient Medications:  . apixaban  5 mg Oral BID  . aspirin EC  81 mg Oral Daily  . docusate sodium  100 mg Oral BID  . furosemide  40 mg Intravenous Q12H  . glipiZIDE  10 mg Oral Q breakfast  . insulin aspart  0-5 Units Subcutaneous QHS  . insulin aspart  0-9 Units Subcutaneous TID WC  . ipratropium-albuterol  3 mL Nebulization QID  . metoprolol succinate  25 mg Oral Daily  . montelukast  10 mg Oral QHS  . multivitamin with minerals  1 tablet Oral Daily  . pantoprazole  40 mg Oral Daily  . piperacillin-tazobactam (ZOSYN)  IV  3.375 g Intravenous 3 times per day  . potassium chloride  20 mEq Oral Daily  . sodium chloride  3 mL Intravenous Q12H  . timolol  1 drop Both Eyes Daily      Allergies:  Allergies  Allergen Reactions  . Cefdinir Diarrhea  . Cephalexin Diarrhea  . Codeine Nausea Only  . Doxycycline Nausea And Vomiting  . Tape Rash    Red rash     History   Social History  . Marital Status: Married    Spouse Name: N/A  . Number of Children: N/A  . Years of Education: N/A   Occupational History  . Not on file.   Social History Main Topics  . Smoking status: Former Smoker    Types: Cigarettes    Quit date: 10/30/1969  . Smokeless tobacco: Never Used  . Alcohol Use: No  . Drug Use: No  . Sexual Activity: Not on file   Other Topics Concern  . Not on file   Social History Narrative     History reviewed. No pertinent family history.   Review of Systems Positive for shortness of breath weakness and fatigue Negative for: General:  chills, fever, night sweats or weight changes.  Cardiovascular: PND orthopnea syncope dizziness  Dermatological skin lesions rashes Respiratory: Cough congestion Urologic: Frequent  urination urination at night and hematuria Abdominal: negative for nausea, vomiting, diarrhea, bright red blood per rectum, melena, or hematemesis Neurologic: negative for visual changes, and/or hearing changes  All other systems reviewed and are otherwise negative except as noted above.  Labs:  Recent Labs  10/31/14 1546 10/31/14 2007 11/01/14 0134  TROPONINI 0.28* 0.24* 0.24*   Lab Results  Component Value Date   WBC 7.0 10/31/2014   HGB 10.8* 10/31/2014   HCT 32.6* 10/31/2014   MCV 79.7* 10/31/2014   PLT 238 10/31/2014    Recent Labs Lab 10/31/14 1546  NA 124*  K 3.8  CL 89*  CO2 27  BUN 18  CREATININE 0.82  CALCIUM 8.2*  PROT 6.6  BILITOT 0.5  ALKPHOS 62  ALT 14*  AST 25  GLUCOSE 185*   No results found for: CHOL, HDL, LDLCALC, TRIG No results found for: DDIMER  Radiology/Studies:  Dg Chest 2 View  10/31/2014   CLINICAL DATA:  Shortness of breath with increasing pedal edema. Atrial fibrillation and possible pneumonia.  EXAM:  CHEST  2 VIEW  COMPARISON:  CT 10/26/2014.  Radiographs 10/14/2014 unavailable.  FINDINGS: The posterior chest is partially excluded from the lateral view. Moderate-sized bilateral pleural effusions appear unchanged. There are worsening bilateral perihilar airspace opacities compared with the scout image from the prior CT. The pulmonary vasculature appears fairly well-defined. The heart size and mediastinal contours are stable. No acute osseous abnormalities identified. There are degenerative changes throughout the thoracic spine with a wedge deformity in the mid thoracic spine, not well seen on axial images from prior CT. This does not have an acute appearance.  IMPRESSION: Worsening bilateral airspace opacities suspicious for pneumonia. Underlying pleural effusions appear about the same.   Electronically Signed   By: Richardean Sale M.D.   On: 10/31/2014 16:24   Ct Angio Chest Pe W/cm &/or Wo Cm  10/26/2014   CLINICAL DATA:  Shortness of breath  for 2 weeks  EXAM: CT ANGIOGRAPHY CHEST WITH CONTRAST  TECHNIQUE: Multidetector CT imaging of the chest was performed using the standard protocol during bolus administration of intravenous contrast. Multiplanar CT image reconstructions and MIPs were obtained to evaluate the vascular anatomy.  CONTRAST:  100 mL Omnipaque 350 nonionic  COMPARISON:  Chest radiograph July 27, 2008  FINDINGS: There is no demonstrable pulmonary embolus. There is atherosclerotic change in aorta without aneurysm.  There are sizable free-flowing pleural effusions bilaterally with bibasilar consolidation. There is airspace opacity throughout portions of both upper lobes and lower lobes as well as patchy consolidation in the inferior aspect of the right middle lobe.  There are multiple foci of coronary artery calcification. Pericardium is not thickened. Thyroid appears normal.  There are multiple small mediastinal lymph nodes. There is a lymph node just to the left of the distal trachea measuring 1.8 x 1.8 cm. There is a sub- carinal lymph node measuring 2.1 x 1.5 cm. There is a lymph node in the right hilum measuring 1.6 x 1.6 cm.  Visualized upper abdominal structures appear unremarkable except for atherosclerotic change in the aorta.  There is extensive degenerative change in the thoracic spine. There is anterior wedging of a mid thoracic vertebral body. There are no blastic or lytic bone lesions. Bones are osteoporotic. There is spinal stenosis at several levels in the lower thoracic region due to bony hypertrophy.  Review of the MIP images confirms the above findings.  IMPRESSION: No demonstrable pulmonary embolus.  Sizable effusions with multiple areas of airspace opacity, likely representing alveolar edema, although superimposed pneumonia may well be present. There does appear to be consolidation in both lung bases which may be at least in part due to atelectatic change. The overall appearance is felt to be most consistent with  congestive heart failure, with potential superimposed pneumonia.  Extensive arthropathy. Spinal stenosis at several levels in the lower thoracic region due to bony overgrowth.  Multiple foci of coronary artery calcification present.  Several mildly prominent lymph nodes are present of uncertain etiology.   Electronically Signed   By: Lowella Grip III M.D.   On: 10/26/2014 18:25    EKG: Atrial fibrillation with controlled ventricular rate and nonspecific ST and T-wave changes  Weights: Filed Weights   10/31/14 1607 11/01/14 0500  Weight: 220 lb (99.791 kg) 214 lb 3.2 oz (97.16 kg)     Physical Exam: Blood pressure 95/58, pulse 77, temperature 97.7 F (36.5 C), temperature source Oral, resp. rate 23, height 6\' 4"  (1.93 m), weight 214 lb 3.2 oz (97.16 kg), SpO2 100 %. Body mass index  is 26.08 kg/(m^2). General: Well developed, well nourished, in no acute distress. Head eyes ears nose throat: Normocephalic, atraumatic, sclera non-icteric, no xanthomas, nares are without discharge. No apparent thyromegaly and/or mass  Lungs: Normal respiratory effort.  Diffuse wheezes, basilar rales, no rhonchi.  Heart: Irregular with normal S1 S2. no murmur gallop, no rub, PMI is normal size and placement, carotid upstroke normal without bruit, jugular venous pressure is normal Abdomen: Soft, non-tender, non-distended with normoactive bowel sounds. No hepatomegaly. No rebound/guarding. No obvious abdominal masses. Abdominal aorta is normal size without bruit Extremities: 1-2+ edema. no cyanosis, no clubbing, no ulcers  Peripheral : 2+ bilateral upper extremity pulses, 2+ bilateral femoral pulses, 2+ bilateral dorsal pedal pulse Neuro: Alert and oriented. No facial asymmetry. No focal deficit. Moves all extremities spontaneously. Musculoskeletal: Normal muscle tone without kyphosis Psych:  Responds to questions appropriately with a normal affect.    Assessment: 81 year old male with known paroxysmal  nonvalvular atrial fibrillation diabetes essential hypertension coronary artery disease with LV systolic dysfunction and previous myocardial infarction with acute on chronic systolic dysfunction congestive heart failure and elevated troponin consistent with demand ischemia and/or other bronchitis exacerbation so potentially related to anemia needing further treatment options  Plan: 1. Continue metoprolol for heart rate control of atrial fibrillation with a heart rate of 60-90 bpm at rest 2. Chin U anticoagulation for further risk reduction in stroke with atrial fibrillation although possible discontinuation at this time for repeat cardiac catheterization. Patient understands the risk and benefits cardiac catheterization. This includes possibility of death stroke heart attack infection bleeding or blood clot. He is at low risk for conscious sedation 3. Continue to venous Lasix for further risk reduction of pulmonary edema and lower extremity edema 4. Consideration of ACE inhibitor if patient will tolerate 5. Echocardiogram for LV systolic dysfunction and worsening ejection fraction 6. Continue anti-biotics and other medication management and inhalers for hypoxia  Signed, Corey Skains M.D. [facility] 11/01/2014, 7:50 AM

## 2014-11-01 NOTE — Progress Notes (Signed)
Initial Nutrition Assessment  DOCUMENTATION CODES:     INTERVENTION:   (Medical Nutrition Supplement) Will add sugar free mightyshake BID for added nutrition. Meals and snacks: Pt may benefit from carb modified heart healthy diet order secondary to current condition.    NUTRITION DIAGNOSIS:  Inadequate oral intake related to other (see comment) (no taste or smell for food) as evidenced by per patient/family report.    GOAL:  Patient will meet greater than or equal to 90% of their needs    MONITOR:   (Energy intake, Electrolyte and renal profile, Anthropometrics)  REASON FOR ASSESSMENT:  Other (Comment), Malnutrition Screening Tool (diagnosis )    ASSESSMENT:  Pt admitted with acute CHF  Past Medical History  Diagnosis Date  . Diabetes mellitus without complication   . Hypertension   . Reflux   . CHF (congestive heart failure), NYHA class III 10/31/2014   Electrolyte and Renal Profile:  Recent Labs Lab 10/31/14 1546 11/01/14 0742  BUN 18 15  CREATININE 0.82 0.78  NA 124* 122*  K 3.8 3.4*    Medications: colace, lasix, MVI, KCL, aspart  Pt reports ate few bites of breakfast this am following procedure/test. Reports drinks ensure and boost prior to admission  Pt reports typically eats cereal for breakfast and something sweet, for lunch hamburger patty and side item (ie potato salad) and supper meat and vegetable.  Reports for the last 2 years no taste for food or smell.   Height:  Ht Readings from Last 1 Encounters:  10/31/14 6\' 4"  (1.93 m)    Weight:  Wt Readings from Last 1 Encounters:  11/01/14 214 lb 3.2 oz (97.16 kg)    Over last year and 3 months weight loss of 14%     Wt Readings from Last 10 Encounters:  11/01/14 214 lb 3.2 oz (97.16 kg)  08/05/13 250 lb (113.399 kg)  05/06/13 250 lb (113.399 kg)   Nutrition-Focused physical exam completed. Findings are WDL for fat depletion, muscle depletion, and edema.     BMI:  Body mass index  is 26.08 kg/(m^2).  Estimated Nutritional Needs:  Kcal:  BEE 1744 kcals (IF 1.1-1.3, AF 1.3) 7616-0737 kcals/d  Protein:  (1.2-1.5 g/d) 116-146 g/d  Fluid:  (25-70ml/kg) 2425-2926ml/d  Skin:  Reviewed, no issues  Diet Order:  Diet Carb Modified Fluid consistency:: Thin; Room service appropriate?: Yes  EDUCATION NEEDS:  No education needs identified at this time   Intake/Output Summary (Last 24 hours) at 11/01/14 1323 Last data filed at 11/01/14 1217  Gross per 24 hour  Intake    120 ml  Output    301 ml  Net   -181 ml    Last BM:  6/6   MODERATE Care Level Tryphena Perkovich B. Zenia Resides, [place], [place] (pager)

## 2014-11-01 NOTE — Progress Notes (Signed)
*  PRELIMINARY RESULTS* Echocardiogram 2D Echocardiogram has been performed.  [name] 11/01/2014, 8:52 AM

## 2014-11-01 NOTE — Evaluation (Signed)
Physical Therapy Evaluation Patient Details Name: [name] MRN: [id number] DOB: August 07, 1931 Today's Date: 11/01/2014   History of Present Illness  Pt admitted for acute on chronic heart failure. Pt with history of CAP, afib, anemia, and elevated troponin with multiple MI. Pt reports no history of falls. Pt with recent complaints of dyspnea upon exertion.  Clinical Impression  Pt is a pleasant 81 year old male who was admitted for acute on chronic heart failure. Pt performs bed mobility with indepdence, transfers with supervision, and ambulation with cga with rw. All mobility performed with 2L of O2 applied. Pt demonstrates deficits with endurance/strength. Would benefit from skilled PT to address above deficits and promote optimal return to PLOF. Recommend transition to OP PT upon discharge from acute hospitalization.       Follow Up Recommendations Outpatient PT    Equipment Recommendations  Rolling walker with 5" wheels    Recommendations for Other Services       Precautions / Restrictions Precautions Precautions: None Restrictions Weight Bearing Restrictions: No      Mobility  Bed Mobility Overal bed mobility: Independent             General bed mobility comments: sitting at EOB upon arrival  Transfers Overall transfer level: Needs assistance Equipment used: Rolling walker (2 wheeled) Transfers: Sit to/from Stand Sit to Stand: Supervision         General transfer comment: sit<>stand with rw and supervision. Safe technique performed. Slightly dizzy upon standing, however resolved quickly  Ambulation/Gait Ambulation/Gait assistance: Min guard Ambulation Distance (Feet): 40 Feet Assistive device: Rolling walker (2 wheeled) Gait Pattern/deviations: WFL(Within Functional Limits)     General Gait Details: ambulated using rw and cga. Pt also performed all mobility on 2L of O2 with sats WNL. Pt complains of mod SOB upon exertion, unable to progress further  distance at this time.  Stairs            Wheelchair Mobility    Modified Rankin (Stroke Patients Only)       Balance Overall balance assessment: Modified Independent                                           Pertinent Vitals/Pain Pain Assessment:  (complains of mod SOB with exertion)    Home Living Family/patient expects to be discharged to:: Private residence Living Arrangements: Spouse/significant other Available Help at Discharge: Family;Available 24 hours/day Type of Home: House Home Access: Level entry;Stairs to enter Entrance Stairs-Rails: Can reach both Entrance Stairs-Number of Steps: 5 Home Layout: One level Home Equipment: None      Prior Function Level of Independence: Independent               Hand Dominance        Extremity/Trunk Assessment   Upper Extremity Assessment: Generalized weakness           Lower Extremity Assessment: Generalized weakness         Communication   Communication: No difficulties  Cognition Arousal/Alertness: Awake/alert Behavior During Therapy: WFL for tasks assessed/performed Overall Cognitive Status: Within Functional Limits for tasks assessed                      General Comments      Exercises Other Exercises Other Exercises: Pt performed B LE seated ther-ex including resisted knee flexion, alt. seated marching, and LAQ.  All ther-ex performed x 10 reps with supervision for correct technique.      Assessment/Plan    PT Assessment Patient needs continued PT services  PT Diagnosis Difficulty walking   PT Problem List Decreased strength;Decreased activity tolerance  PT Treatment Interventions Gait training;DME instruction;Therapeutic exercise   PT Goals (Current goals can be found in the Care Plan section) Acute Rehab PT Goals Patient Stated Goal: to go home PT Goal Formulation: With patient Time For Goal Achievement: 11/15/14 Potential to Achieve Goals: Good     Frequency Min 2X/week   Barriers to discharge        Co-evaluation               End of Session Equipment Utilized During Treatment: Gait belt;Oxygen Activity Tolerance: Patient tolerated treatment well;Patient limited by fatigue Patient left: in bed;with bed alarm set Nurse Communication: Mobility status         Time: 1030-1048 PT Time Calculation (min) (ACUTE ONLY): 18 min   Charges:   PT Evaluation $Initial PT Evaluation Tier I: 1 Procedure PT Treatments $Therapeutic Exercise: 8-22 mins   PT G Codes:       Greggory Stallion, PT, DPT [phone]  August Gosser 11/01/2014, 11:16 AM

## 2014-11-02 ENCOUNTER — Inpatient Hospital Stay: Payer: Medicare Other

## 2014-11-02 LAB — GLUCOSE, CAPILLARY
Glucose-Capillary: 120 mg/dL — ABNORMAL HIGH (ref 65–99)
Glucose-Capillary: 164 mg/dL — ABNORMAL HIGH (ref 65–99)
Glucose-Capillary: 119 mg/dL — ABNORMAL HIGH (ref 65–99)
Glucose-Capillary: 140 mg/dL — ABNORMAL HIGH (ref 65–99)

## 2014-11-02 LAB — BASIC METABOLIC PANEL
ANION GAP: 8 (ref 5–15)
BUN: 13 mg/dL (ref 6–20)
CALCIUM: 7.9 mg/dL — AB (ref 8.9–10.3)
CO2: 26 mmol/L (ref 22–32)
Chloride: 88 mmol/L — ABNORMAL LOW (ref 101–111)
Creatinine, Ser: 0.8 mg/dL (ref 0.61–1.24)
GFR calc Af Amer: >60 mL/min (ref >60)
GFR calc non Af Amer: >60 mL/min (ref >60)
Glucose, Bld: 102 mg/dL — ABNORMAL HIGH (ref 65–99)
POTASSIUM: 3.3 mmol/L — AB (ref 3.5–5.1)
Sodium: 122 mmol/L — ABNORMAL LOW (ref 135–145)

## 2014-11-02 LAB — CBC WITH DIFFERENTIAL/PLATELET
Basophils Absolute: 0.1 10*3/uL (ref 0–0.1)
Basophils Relative: 1 %
Eosinophils Absolute: 0.4 10*3/uL (ref 0–0.7)
Eosinophils Relative: 6 %
HEMATOCRIT: 30.6 % — AB (ref 40.0–52.0)
Hemoglobin: 10.4 g/dL — ABNORMAL LOW (ref 13.0–18.0)
LYMPHS PCT: 17 %
Lymphs Abs: 1.1 10*3/uL (ref 1.0–3.6)
MCH: 26.7 pg (ref 26.0–34.0)
MCHC: 33.9 g/dL (ref 32.0–36.0)
MCV: 78.7 fL — ABNORMAL LOW (ref 80.0–100.0)
Monocytes Absolute: 0.8 10*3/uL (ref 0.2–1.0)
Monocytes Relative: 12 %
Neutro Abs: 4 10*3/uL (ref 1.4–6.5)
Neutrophils Relative %: 64 %
Platelets: 223 10*3/uL (ref 150–440)
RBC: 3.88 MIL/uL — AB (ref 4.40–5.90)
RDW: 16.2 % — ABNORMAL HIGH (ref 11.5–14.5)
WBC: 6.3 10*3/uL (ref 3.8–10.6)

## 2014-11-02 MED ORDER — POTASSIUM CHLORIDE IN NACL 40-0.9 MEQ/L-% IV SOLN
INTRAVENOUS | Status: DC
  Filled 2014-11-02: qty 1000

## 2014-11-02 MED ORDER — POTASSIUM CHLORIDE 20 MEQ PO PACK
20.0000 meq | PACK | Freq: Two times a day (BID) | ORAL | Status: DC
  Administered 2014-11-02: 20 meq via ORAL
  Administered 2014-11-02: 10:00:00 via ORAL
  Administered 2014-11-03: 20 meq via ORAL
  Filled 2014-11-02 (×3): qty 1

## 2014-11-02 MED ORDER — LORAZEPAM 2 MG/ML IJ SOLN
1.0000 mg | Freq: Once | INTRAMUSCULAR | Status: AC
  Administered 2014-11-02: 1 mg via INTRAVENOUS
  Filled 2014-11-02: qty 1

## 2014-11-02 MED ORDER — FUROSEMIDE 40 MG PO TABS
40.0000 mg | ORAL_TABLET | Freq: Two times a day (BID) | ORAL | Status: DC
  Administered 2014-11-02 – 2014-11-03 (×2): 40 mg via ORAL
  Filled 2014-11-02 (×3): qty 1

## 2014-11-02 MED ORDER — SODIUM CHLORIDE 0.9 % IV SOLN
INTRAVENOUS | Status: DC
  Administered 2014-11-02: 13:00:00 via INTRAVENOUS

## 2014-11-02 NOTE — Progress Notes (Signed)
Physical Therapy Treatment Patient Details Name: [name] MRN: [id number] DOB: March 13, 1932 Today's Date: November 15, 2014    History of Present Illness Pt admitted for acute on chronic heart failure. Pt with history of CAP, afib, anemia, and elevated troponin with multiple MI. Pt reports no history of falls. Pt with recent complaints of dyspnea upon exertion.    PT Comments    Pt c/o SOB upon waking.  Refused walking today but willing to perform seated therex and attempted standing activities.  Pt stated increased fatigue with standing activities, SpO2 maintained above 90% with cues for PLB. HR increased to 90-94 with standing.  BP checked after standing 104/59.   Follow Up Recommendations  Outpatient PT     Equipment Recommendations  Rolling walker with 5" wheels    Recommendations for Other Services       Precautions / Restrictions Restrictions Weight Bearing Restrictions: No    Mobility  Bed Mobility Overal bed mobility: Independent             General bed mobility comments: asleep in bed, head elevated  Transfers Overall transfer level: Needs assistance Equipment used: Rolling walker (2 wheeled) Transfers: Sit to/from Stand Sit to Stand: Supervision         General transfer comment: sit to stand while performing static standing activites   Ambulation/Gait                 Stairs            Wheelchair Mobility    Modified Rankin (Stroke Patients Only)       Balance                                    Cognition Arousal/Alertness: Awake/alert Behavior During Therapy: WFL for tasks assessed/performed Overall Cognitive Status: Within Functional Limits for tasks assessed                      Exercises Other Exercises Other Exercises: Pt performed B LE seated ther-ex including resisted knee flexion, alt. seated marching, and LAQ. All ther-ex performed x 15 reps with supervision for correct technique.    General  Comments        Pertinent Vitals/Pain      Home Living                      Prior Function            PT Goals (current goals can now be found in the care plan section) Acute Rehab PT Goals Patient Stated Goal: to go home PT Goal Formulation: With patient Time For Goal Achievement: 11/15/14 Potential to Achieve Goals: Good    Frequency  Min 2X/week    PT Plan      Co-evaluation             End of Session Equipment Utilized During Treatment: Gait belt;Oxygen Activity Tolerance: Patient tolerated treatment well;Patient limited by fatigue Patient left: in bed;with bed alarm set     Time: 1335-1400 PT Time Calculation (min) (ACUTE ONLY): 25 min  Charges:  $Therapeutic Exercise: 8-22 mins $Therapeutic Activity: 8-22 mins                    G Codes:      [name] 11/15/14, 2:08 PM  [name], PTA

## 2014-11-02 NOTE — Progress Notes (Signed)
[name] is a 81 y.o. male  Acute on chronic systolic and diastolic heart failure, NYHA class 3   SUBJECTIVE:  Pt remains hypoxic and SOB with orthopnea. Cardiology consult noted. Echo results pending. Sodium and K+ low. Hgb stable. CXR yesterday suggests PNA.  ______________________________________________________________________  ROS: Review of systems is unremarkable for any active cardiac,respiratory, GI, GU, hematologic, neurologic or psychiatric systems, 10 systems reviewed.  @CMEDLIST @  Past Medical History  Diagnosis Date  . Diabetes mellitus without complication   . Hypertension   . Reflux   . CHF (congestive heart failure), NYHA class III 10/31/2014    History reviewed. No pertinent past surgical history.  PHYSICAL EXAM:  BP 118/70 mmHg  Pulse 74  Temp(Src) 98.3 F (36.8 C) (Oral)  Resp 18  Ht 6\' 4"  (1.93 m)  Wt 98.233 kg (216 lb 9 oz)  BMI 26.37 kg/m2  SpO2 99%  Wt Readings from Last 3 Encounters:  11/02/14 98.233 kg (216 lb 9 oz)  08/05/13 113.399 kg (250 lb)  05/06/13 113.399 kg (250 lb)            Constitutional: NAD Neck: supple, no thyromegaly Respiratory: basilar rales with dullness Cardiovascular: irregularly irregular, no murmur, no gallop Abdomen: soft, good BS, nontender Extremities: trace edema Neuro: alert and oriented, no focal motor or sensory deficits  ASSESSMENT/PLAN:  Labs and imaging studies were reviewed  CT chest today. Begin IV NS with K+. Await f/u from Cardiology. Continue PT. SNF placement recommended. F/u labs in AM. Wean O2 as tolerated.

## 2014-11-02 NOTE — Plan of Care (Signed)
Problem: Phase I Progression Outcomes Goal: Initial discharge plan identified Outcome: Progressing Patient has no acute event overnight, he remained in afib on the monitor. Patient was restless for most of the night due to orthopnea.He remained on supplemental oxygen He could not tolerate lying flat, he spent most of the night sitting up with pillow support. Patient was educated about fall prevention and the bed and recliner alarm were set on. Patient needed item were placed within patient's reach and was provided bathroom assistance with safety rounding.

## 2014-11-02 NOTE — Progress Notes (Signed)
[facility] Encounter Note  Patient: [name] / Admit Date: 10/31/2014 / Date of Encounter: 11/02/2014, 8:08 AM   Subjective: Still with shortness of breath but no evidence of chest pain  Review of Systems: Positive for: Shortness of breath Negative for: Vision change, hearing change, syncope, dizziness, nausea, vomiting,diarrhea, bloody stool, stomach pain, cough, congestion, diaphoresis, urinary frequency, urinary pain,skin lesions, skin rashes Others previously listed  Objective: Telemetry: Atrial fibrillation Physical Exam: Blood pressure 118/70, pulse 74, temperature 98.3 F (36.8 C), temperature source Oral, resp. rate 18, height 6\' 4"  (1.93 m), weight 216 lb 9 oz (98.233 kg), SpO2 99 %. Body mass index is 26.37 kg/(m^2). General: Well developed, well nourished, in no acute distress. Head: Normocephalic, atraumatic, sclera non-icteric, no xanthomas, nares are without discharge. Neck: No apparent masses Lungs: Normal respirations with no wheezes, diffuse rhonchi, no rales , basilar crackles   Heart: Irregular rate and rhythm, normal S1 S2, no murmur, no rub, no gallop, PMI is normal size and placement, carotid upstroke normal without bruit, jugular venous pressure normal Abdomen: Soft, non-tender, non-distended with normoactive bowel sounds. No hepatosplenomegaly. Abdominal aorta is normal size without bruit Extremities: 1+ edema, no clubbing, no cyanosis, no ulcers,  Peripheral: 2+ radial, 2+ femoral, 2+ dorsal pedal pulses Neuro: Alert and oriented. Moves all extremities spontaneously. Psych:  Responds to questions appropriately with a normal affect.   Intake/Output Summary (Last 24 hours) at 11/02/14 0808 Last data filed at 11/02/14 5732  Gross per 24 hour  Intake    700 ml  Output   2000 ml  Net  -1300 ml    Inpatient Medications:  . aspirin EC  81 mg Oral Daily  . docusate sodium  100 mg Oral BID  . furosemide  40 mg Oral BID  . glipiZIDE   10 mg Oral Q breakfast  . insulin aspart  0-5 Units Subcutaneous QHS  . insulin aspart  0-9 Units Subcutaneous TID WC  . ipratropium-albuterol  3 mL Nebulization QID  . metoprolol succinate  25 mg Oral Daily  . montelukast  10 mg Oral QHS  . multivitamin with minerals  1 tablet Oral Daily  . pantoprazole  40 mg Oral Daily  . piperacillin-tazobactam (ZOSYN)  IV  3.375 g Intravenous 3 times per day  . potassium chloride  20 mEq Oral BID  . sodium chloride  3 mL Intravenous Q12H  . timolol  1 drop Both Eyes Daily   Infusions:  . sodium chloride      Labs:  Recent Labs  11/01/14 0742 11/02/14 0353  NA 122* 122*  K 3.4* 3.3*  CL 87* 88*  CO2 26 26  GLUCOSE 167* 102*  BUN 15 13  CREATININE 0.78 0.80  CALCIUM 7.9* 7.9*    Recent Labs  10/31/14 1546 11/01/14 0742  AST 25 24  ALT 14* 14*  ALKPHOS 62 58  BILITOT 0.5 0.7  PROT 6.6 6.4*  ALBUMIN 2.8* 2.8*    Recent Labs  10/31/14 1546 11/01/14 0742 11/02/14 0353  WBC 7.0 6.6 6.3  NEUTROABS 4.8  --  4.0  HGB 10.8* 10.4* 10.4*  HCT 32.6* 31.0* 30.6*  MCV 79.7* 79.1* 78.7*  PLT 238 213 223    Recent Labs  10/31/14 1546 10/31/14 2007 11/01/14 0134 11/01/14 0742  TROPONINI 0.28* 0.24* 0.24* 0.19*   Invalid input(s): POCBNP No results for input(s): HGBA1C in the last 72 hours.   Weights: Filed Weights   10/31/14 1607 11/01/14 0500 11/02/14 2025  Weight: 220 lb (99.791 kg) 214 lb 3.2 oz (97.16 kg) 216 lb 9 oz (98.233 kg)     Radiology/Studies:  X-ray Chest Pa And Lateral  11/01/2014   CLINICAL DATA:  Shortness of Breath  EXAM: CHEST  2 VIEW  COMPARISON:  10/31/2014  FINDINGS: Bilateral airspace opacities are again noted, unchanged. Heart is normal size. Underline hyperinflation of the lungs. Trace bilateral effusions. No acute bony abnormality.  IMPRESSION: Stable bilateral airspace opacities concerning for pneumonia. Trace bilateral effusions.   Electronically Signed   By: Rolm Baptise M.D.   On: 11/01/2014  09:21   Dg Chest 2 View  10/31/2014   CLINICAL DATA:  Shortness of breath with increasing pedal edema. Atrial fibrillation and possible pneumonia.  EXAM: CHEST  2 VIEW  COMPARISON:  CT 10/26/2014.  Radiographs 10/14/2014 unavailable.  FINDINGS: The posterior chest is partially excluded from the lateral view. Moderate-sized bilateral pleural effusions appear unchanged. There are worsening bilateral perihilar airspace opacities compared with the scout image from the prior CT. The pulmonary vasculature appears fairly well-defined. The heart size and mediastinal contours are stable. No acute osseous abnormalities identified. There are degenerative changes throughout the thoracic spine with a wedge deformity in the mid thoracic spine, not well seen on axial images from prior CT. This does not have an acute appearance.  IMPRESSION: Worsening bilateral airspace opacities suspicious for pneumonia. Underlying pleural effusions appear about the same.   Electronically Signed   By: Richardean Sale M.D.   On: 10/31/2014 16:24   Ct Angio Chest Pe W/cm &/or Wo Cm  10/26/2014   CLINICAL DATA:  Shortness of breath for 2 weeks  EXAM: CT ANGIOGRAPHY CHEST WITH CONTRAST  TECHNIQUE: Multidetector CT imaging of the chest was performed using the standard protocol during bolus administration of intravenous contrast. Multiplanar CT image reconstructions and MIPs were obtained to evaluate the vascular anatomy.  CONTRAST:  100 mL Omnipaque 350 nonionic  COMPARISON:  Chest radiograph July 27, 2008  FINDINGS: There is no demonstrable pulmonary embolus. There is atherosclerotic change in aorta without aneurysm.  There are sizable free-flowing pleural effusions bilaterally with bibasilar consolidation. There is airspace opacity throughout portions of both upper lobes and lower lobes as well as patchy consolidation in the inferior aspect of the right middle lobe.  There are multiple foci of coronary artery calcification. Pericardium is not  thickened. Thyroid appears normal.  There are multiple small mediastinal lymph nodes. There is a lymph node just to the left of the distal trachea measuring 1.8 x 1.8 cm. There is a sub- carinal lymph node measuring 2.1 x 1.5 cm. There is a lymph node in the right hilum measuring 1.6 x 1.6 cm.  Visualized upper abdominal structures appear unremarkable except for atherosclerotic change in the aorta.  There is extensive degenerative change in the thoracic spine. There is anterior wedging of a mid thoracic vertebral body. There are no blastic or lytic bone lesions. Bones are osteoporotic. There is spinal stenosis at several levels in the lower thoracic region due to bony hypertrophy.  Review of the MIP images confirms the above findings.  IMPRESSION: No demonstrable pulmonary embolus.  Sizable effusions with multiple areas of airspace opacity, likely representing alveolar edema, although superimposed pneumonia may well be present. There does appear to be consolidation in both lung bases which may be at least in part due to atelectatic change. The overall appearance is felt to be most consistent with congestive heart failure, with potential superimposed pneumonia.  Extensive arthropathy.  Spinal stenosis at several levels in the lower thoracic region due to bony overgrowth.  Multiple foci of coronary artery calcification present.  Several mildly prominent lymph nodes are present of uncertain etiology.   Electronically Signed   By: Lowella Grip III M.D.   On: 10/26/2014 18:25     Assessment and Recommendation  81 y.o. male with known paroxysmal nonvalvular atrial fibrillation with known coronary artery disease status post previous myocardial infarction with progression of congestive heart failure which appears to be acute on chronic systolic and concerns of further myocardial ischemia and/or anginal equivalent elevated troponin consistent with demand ischemia rather than acute coronary syndrome 1. Hold  anticoagulation at this time for cardiac catheterization on Thursday for further evaluation of coronary anatomy and extent of congestive heart failure with right and left heart catheter. Patient understands risk and benefits of cardiac catheterization. This included the possibility of death stroke heart attack infection bleeding or blood clot. He is at low risk for conscious sedation 2. Continue metoprolol for rate control of atrial fibrillation 3. Continue furosemide for lower extremity edema pulmonary edema formed congestive heart failure 4. Echocardiogram for LV systolic dysfunction valvular heart disease with comparison to old echocardiogram and new evidence of need and adjustments of medication management 5. Continue inhalers and antibiotics for possible bronchitis exacerbating hypoxia 6. Further diagnostic testing and treatment options after above  Signed, Serafina Royals M.D. FACC

## 2014-11-03 LAB — BASIC METABOLIC PANEL
Anion gap: 9 (ref 5–15)
BUN: 13 mg/dL (ref 6–20)
CHLORIDE: 90 mmol/L — AB (ref 101–111)
CO2: 27 mmol/L (ref 22–32)
CREATININE: 0.81 mg/dL (ref 0.61–1.24)
Calcium: 8.1 mg/dL — ABNORMAL LOW (ref 8.9–10.3)
GFR calc non Af Amer: >60 mL/min (ref >60)
Glucose, Bld: 130 mg/dL — ABNORMAL HIGH (ref 65–99)
POTASSIUM: 3.4 mmol/L — AB (ref 3.5–5.1)
Sodium: 126 mmol/L — ABNORMAL LOW (ref 135–145)

## 2014-11-03 LAB — CBC WITH DIFFERENTIAL/PLATELET
Basophils Absolute: 0.2 10*3/uL — ABNORMAL HIGH (ref 0–0.1)
Basophils Relative: 3 %
Eosinophils Absolute: 0.3 10*3/uL (ref 0–0.7)
Eosinophils Relative: 5 %
HEMATOCRIT: 30.1 % — AB (ref 40.0–52.0)
Hemoglobin: 9.8 g/dL — ABNORMAL LOW (ref 13.0–18.0)
Lymphocytes Relative: 15 %
Lymphs Abs: 0.8 10*3/uL — ABNORMAL LOW (ref 1.0–3.6)
MCH: 26 pg (ref 26.0–34.0)
MCHC: 32.7 g/dL (ref 32.0–36.0)
MCV: 79.4 fL — ABNORMAL LOW (ref 80.0–100.0)
Monocytes Absolute: 0.6 10*3/uL (ref 0.2–1.0)
Monocytes Relative: 12 %
NEUTROS ABS: 3.5 10*3/uL (ref 1.4–6.5)
NEUTROS PCT: 65 %
Platelets: 211 10*3/uL (ref 150–440)
RBC: 3.79 MIL/uL — ABNORMAL LOW (ref 4.40–5.90)
RDW: 16.1 % — ABNORMAL HIGH (ref 11.5–14.5)
WBC: 5.3 10*3/uL (ref 3.8–10.6)

## 2014-11-03 LAB — GLUCOSE, CAPILLARY: Glucose-Capillary: 124 mg/dL — ABNORMAL HIGH (ref 65–99)

## 2014-11-03 MED ORDER — POTASSIUM CHLORIDE 20 MEQ PO PACK: 20.0000 meq | PACK | Freq: Two times a day (BID) | ORAL | Status: AC

## 2014-11-03 MED ORDER — GLIPIZIDE ER 10 MG PO TB24: 10.0000 mg | ORAL_TABLET | Freq: Every day | ORAL | Status: AC

## 2014-11-03 MED ORDER — TIMOLOL MALEATE 0.5 % OP SOLN: 1.0000 [drp] | Freq: Every day | OPHTHALMIC | Status: AC

## 2014-11-03 MED ORDER — ONDANSETRON HCL 4 MG PO TABS: 4.0000 mg | ORAL_TABLET | Freq: Four times a day (QID) | ORAL | Status: AC | PRN

## 2014-11-03 MED ORDER — FUROSEMIDE 40 MG PO TABS: 40.0000 mg | ORAL_TABLET | Freq: Two times a day (BID) | ORAL | Status: DC

## 2014-11-03 MED ORDER — ASPIRIN 81 MG PO TBEC: 81.0000 mg | DELAYED_RELEASE_TABLET | Freq: Every day | ORAL | Status: DC

## 2014-11-03 MED ORDER — IPRATROPIUM-ALBUTEROL 0.5-2.5 (3) MG/3ML IN SOLN
3.0000 mL | RESPIRATORY_TRACT | Status: DC
  Administered 2014-11-03: 3 mL via RESPIRATORY_TRACT

## 2014-11-03 MED ORDER — DOCUSATE SODIUM 100 MG PO CAPS: 100.0000 mg | ORAL_CAPSULE | Freq: Two times a day (BID) | ORAL | Status: AC

## 2014-11-03 MED ORDER — IPRATROPIUM-ALBUTEROL 0.5-2.5 (3) MG/3ML IN SOLN
RESPIRATORY_TRACT | Status: AC
  Filled 2014-11-03: qty 3

## 2014-11-03 MED ORDER — IPRATROPIUM-ALBUTEROL 20-100 MCG/ACT IN AERS: 1.0000 | INHALATION_SPRAY | Freq: Four times a day (QID) | RESPIRATORY_TRACT | Status: DC

## 2014-11-03 MED ORDER — AMOXICILLIN-POT CLAVULANATE 875-125 MG PO TABS: 1.0000 | ORAL_TABLET | Freq: Two times a day (BID) | ORAL | Status: DC

## 2014-11-03 NOTE — Progress Notes (Signed)
D/c telemetry and PIV.  VSS, A&O.  Patient agreeable to discharge plans and home health.  No questions.  Rx given to patient.  Wife at bedside.  Patient to be escorted out of hospital via wheelchair by volunteers.

## 2014-11-03 NOTE — Discharge Summary (Signed)
[name], is a 81 y.o. male  DOB April 04, 1932  MRN [id number].  Admission date:  10/31/2014  Admitting Physician  Idelle Crouch, MD  Discharge Date:  11/03/2014   Primary MD  Harrison Paulson D, MD  Recommendations for primary care physician for things to follow:   none   Admission Diagnosis  Hyponatremia [E87.1] Acute pulmonary edema [J81.0] Peripheral edema [R60.9] Congestive heart failure, unspecified congestive heart failure chronicity, unspecified congestive heart failure type [I50.9]   Discharge Diagnosis  Hyponatremia [E87.1] Acute pulmonary edema [J81.0] Peripheral edema [R60.9] Congestive heart failure, unspecified congestive heart failure chronicity, unspecified congestive heart failure type [I50.9]    Principal Problem:   Acute on chronic systolic and diastolic heart failure, NYHA class 3 Active Problems:   CAP (community acquired pneumonia)   Atrial fibrillation   Hyponatremia   Anemia, chronic disease   Elevated troponin   CHF (congestive heart failure), NYHA class III      Past Medical History  Diagnosis Date  . Diabetes mellitus without complication   . Hypertension   . Reflux   . CHF (congestive heart failure), NYHA class III 10/31/2014    History reviewed. No pertinent past surgical history.     History of present illness and  Hospital Course:     Kindly see H&P for history of present illness and admission details, please review complete Labs, Consult reports and Test reports for all details in brief  HPI  from the history and physical done on the day of admission    Hospital Course    Pt admitted with CHF and CAP. Seen by Cardiology. Started on IV LAsix with improvement. Initially on IV ABX but changed to po. CT suggested CHF with possible PNA. Pt weaned off O2. Ambulated with PT. This AM, pt demanding D/C home. Refuses further testing or treatment at this  time.   Discharge Condition: stable   Follow UP  Follow-up Information    Follow up with Lorenso Quirino D, MD In 1 week.   Specialty:  Internal Medicine   Contact information:   [id number] [phone]         Discharge Instructions  and  Discharge Medications   See below     Medication List    STOP taking these medications        levofloxacin 500 MG tablet  Commonly known as:  LEVAQUIN     quinapril-hydrochlorothiazide 20-12.5 MG per tablet  Commonly known as:  ACCURETIC      TAKE these medications        amoxicillin-clavulanate 875-125 MG per tablet  Commonly known as:  AUGMENTIN  Take 1 tablet by mouth 2 (two) times daily.     aspirin 81 MG EC tablet  Take 1 tablet (81 mg total) by mouth daily.     CENTRUM SILVER tablet  Take 1 tablet by mouth daily.     docusate sodium 100 MG capsule  Commonly known as:  COLACE  Take 1  capsule (100 mg total) by mouth 2 (two) times daily.     ELIQUIS 5 MG Tabs tablet  Generic drug:  apixaban  Take 5 mg by mouth daily.     EQL OMEGA 3 FISH OIL 1200 MG Caps  Take 1,200 mg by mouth daily.     fluticasone 50 MCG/ACT nasal spray  Commonly known as:  FLONASE  Place 2 puffs into the nose daily.     furosemide 40 MG tablet  Commonly known as:  LASIX  Take 1 tablet (40 mg total) by mouth 2 (two) times daily.     glipiZIDE 10 MG 24 hr tablet  Commonly known as:  GLIPIZIDE XL  Take 1 tablet (10 mg total) by mouth daily with breakfast.     glucose blood test strip  Apply 1 each topically 2 (two) times daily.     ipratropium 0.03 % nasal spray  Commonly known as:  ATROVENT  Place 2 sprays into the nose 3 (three) times daily as needed.     montelukast 10 MG tablet  Commonly known as:  SINGULAIR  Take 10 mg by mouth daily.     omeprazole 40 MG capsule  Commonly known as:  PRILOSEC  Take 40 mg by mouth daily.     ondansetron 4 MG tablet  Commonly known as:   ZOFRAN  Take 1 tablet (4 mg total) by mouth every 6 (six) hours as needed for nausea.     potassium chloride 20 MEQ packet  Commonly known as:  KLOR-CON  Take 20 mEq by mouth 2 (two) times daily.     simvastatin 20 MG tablet  Commonly known as:  ZOCOR  Take 20 mg by mouth at bedtime.     SYMBICORT 160-4.5 MCG/ACT inhaler  Generic drug:  budesonide-formoterol  Inhale 2 puffs into the lungs 2 (two) times daily.     timolol 0.5 % ophthalmic solution  Commonly known as:  TIMOPTIC  Place 1 drop into both eyes daily.     TOPROL XL 25 MG 24 hr tablet  Generic drug:  metoprolol succinate  Take 25 mg by mouth daily.     Vitamin B Complex Tabs  Take 1 tablet by mouth daily.     Vitamin D3 2000 UNITS capsule  Take 2,000 Units by mouth daily.     vitamin E 400 UNIT capsule  Take 400 Units by mouth daily.          Diet and Activity recommendation: See Discharge Instructions above   Consults obtained - Cardiology   Major procedures and Radiology Reports - PLEASE review detailed and final reports for all details, in brief -   See below   X-ray Chest Pa And Lateral  11/01/2014   CLINICAL DATA:  Shortness of Breath  EXAM: CHEST  2 VIEW  COMPARISON:  10/31/2014  FINDINGS: Bilateral airspace opacities are again noted, unchanged. Heart is normal size. Underline hyperinflation of the lungs. Trace bilateral effusions. No acute bony abnormality.  IMPRESSION: Stable bilateral airspace opacities concerning for pneumonia. Trace bilateral effusions.   Electronically Signed   By: Rolm Baptise M.D.   On: 11/01/2014 09:21   Dg Chest 2 View  10/31/2014   CLINICAL DATA:  Shortness of breath with increasing pedal edema. Atrial fibrillation and possible pneumonia.  EXAM: CHEST  2 VIEW  COMPARISON:  CT 10/26/2014.  Radiographs 10/14/2014 unavailable.  FINDINGS: The posterior chest is partially excluded from the lateral view. Moderate-sized bilateral pleural effusions appear unchanged. There are  worsening bilateral perihilar airspace opacities compared with the scout image from the prior CT. The pulmonary vasculature appears fairly well-defined. The heart size and mediastinal contours are stable. No acute osseous abnormalities identified. There are degenerative changes throughout the thoracic spine with a wedge deformity in the mid thoracic spine, not well seen on axial images from prior CT. This does not have an acute appearance.  IMPRESSION: Worsening bilateral airspace opacities suspicious for pneumonia. Underlying pleural effusions appear about the same.   Electronically Signed   By: Richardean Sale M.D.   On: 10/31/2014 16:24   Ct Chest Wo Contrast  11/02/2014   CLINICAL DATA:  Worsening shortness of breath. Increased peripheral edema. Acute on chronic congestive heart failure, NYHA class 3  EXAM: CT CHEST WITHOUT CONTRAST  TECHNIQUE: Multidetector CT imaging of the chest was performed following the standard protocol without IV contrast.  COMPARISON:  10/26/2014  FINDINGS: Mediastinum/Lymph Nodes: Mild mediastinal lymphadenopathy is stable and nonspecific. This may be reactive in etiology.  Lungs/Pleura: Moderate bilateral pleural effusions show no significant change in size. Bilateral heterogeneous pulmonary airspace disease is again seen. There is mild worsening seen in the right upper lobe, but otherwise this remains stable. This may be due to diffuse pulmonary edema, although pneumonia or other inflammatory process cannot definitely be excluded.  Musculoskeletal/Soft Tissues: No suspicious bone lesions or other significant chest wall abnormality.  Upper Abdomen: Tiny calcified gallstones again noted. Enlargement of left and caudate lobes of liver again demonstrated, suspicious for hepatic cirrhosis.  IMPRESSION: Bilateral multi lobar heterogeneous pulmonary airspace disease, with mild interval worsening seen in the right upper lobe. This is likely due to diffuse pulmonary edema although pneumonia  or other inflammatory process cannot be excluded.  No significant change in moderate bilateral pleural effusions.  Stable nonspecific mild mediastinal lymphadenopathy, likely reactive in etiology.  Cholelithiasis.  No radiographic evidence of cholecystitis.  Probable hepatic cirrhosis.   Electronically Signed   By: Earle Gell M.D.   On: 11/02/2014 10:25   Ct Angio Chest Pe W/cm &/or Wo Cm  10/26/2014   CLINICAL DATA:  Shortness of breath for 2 weeks  EXAM: CT ANGIOGRAPHY CHEST WITH CONTRAST  TECHNIQUE: Multidetector CT imaging of the chest was performed using the standard protocol during bolus administration of intravenous contrast. Multiplanar CT image reconstructions and MIPs were obtained to evaluate the vascular anatomy.  CONTRAST:  100 mL Omnipaque 350 nonionic  COMPARISON:  Chest radiograph July 27, 2008  FINDINGS: There is no demonstrable pulmonary embolus. There is atherosclerotic change in aorta without aneurysm.  There are sizable free-flowing pleural effusions bilaterally with bibasilar consolidation. There is airspace opacity throughout portions of both upper lobes and lower lobes as well as patchy consolidation in the inferior aspect of the right middle lobe.  There are multiple foci of coronary artery calcification. Pericardium is not thickened. Thyroid appears normal.  There are multiple small mediastinal lymph nodes. There is a lymph node just to the left of the distal trachea measuring 1.8 x 1.8 cm. There is a sub- carinal lymph node measuring 2.1 x 1.5 cm. There is a lymph node in the right hilum measuring 1.6 x 1.6 cm.  Visualized upper abdominal structures appear unremarkable except for atherosclerotic change in the aorta.  There is extensive degenerative change in the thoracic spine. There is anterior wedging of a mid thoracic vertebral body. There are no blastic or lytic bone lesions. Bones are osteoporotic. There is spinal stenosis at several levels in the lower thoracic  region due to bony  hypertrophy.  Review of the MIP images confirms the above findings.  IMPRESSION: No demonstrable pulmonary embolus.  Sizable effusions with multiple areas of airspace opacity, likely representing alveolar edema, although superimposed pneumonia may well be present. There does appear to be consolidation in both lung bases which may be at least in part due to atelectatic change. The overall appearance is felt to be most consistent with congestive heart failure, with potential superimposed pneumonia.  Extensive arthropathy. Spinal stenosis at several levels in the lower thoracic region due to bony overgrowth.  Multiple foci of coronary artery calcification present.  Several mildly prominent lymph nodes are present of uncertain etiology.   Electronically Signed   By: Lowella Grip III M.D.   On: 10/26/2014 18:25    Micro Results   none  No results found for this or any previous visit (from the past 240 hour(s)).     Today   Subjective:   [name] today has no headache,no chest abdominal pain,no new weakness tingling or numbness, feels much better wants to go home today. Edema and SOB have resolved. Stable on RA.  Objective:   Blood pressure 107/58, pulse 76, temperature 98.3 F (36.8 C), temperature source Oral, resp. rate 19, height 6\' 4"  (1.93 m), weight 98.233 kg (216 lb 9 oz), SpO2 100 %.   Intake/Output Summary (Last 24 hours) at 11/03/14 0724 Last data filed at 11/03/14 0359  Gross per 24 hour  Intake   1011 ml  Output   3900 ml  Net  -2889 ml    Exam Awake Alert, Oriented x 3, No new F.N deficits, Normal affect [place].AT,PERRAL Supple Neck,No JVD, No cervical lymphadenopathy appriciated.  Symmetrical Chest wall movement, Good air movement bilaterally, basilar rales noted RRR,No Gallops,Rubs or new Murmurs, No Parasternal Heave +ve B.Sounds, Abd Soft, Non tender, No organomegaly appriciated, No rebound -guarding or rigidity. No Cyanosis, Clubbing or edema, No new Rash or  bruise  Data Review   CBC w Diff: Lab Results  Component Value Date   WBC 5.3 11/03/2014   WBC 4.3 07/02/2014   HGB 9.8* 11/03/2014   HGB 13.8 07/02/2014   HCT 30.1* 11/03/2014   HCT 40.2 07/02/2014   PLT 211 11/03/2014   PLT 104* 07/02/2014   LYMPHOPCT 15 11/03/2014   LYMPHOPCT 21.2 07/02/2014   MONOPCT 12 11/03/2014   MONOPCT 13.5 07/02/2014   EOSPCT 5 11/03/2014   EOSPCT 3.2 07/02/2014   BASOPCT 3 11/03/2014   BASOPCT 1.2 07/02/2014    CMP: Lab Results  Component Value Date   NA 126* 11/03/2014   NA 139 06/22/2014   K 3.4* 11/03/2014   K 3.6 06/22/2014   CL 90* 11/03/2014   CL 103 06/22/2014   CO2 27 11/03/2014   CO2 26 06/22/2014   BUN 13 11/03/2014   BUN 8 06/22/2014   CREATININE 0.81 11/03/2014   CREATININE 0.68 06/22/2014   PROT 6.4* 11/01/2014   ALBUMIN 2.8* 11/01/2014   BILITOT 0.7 11/01/2014   ALKPHOS 58 11/01/2014   AST 24 11/01/2014   ALT 14* 11/01/2014  .   Total Time in preparing paper work, data evaluation and todays exam - 69 minutes  Tiaira Arambula D M.D on 11/03/2014 at 7:24 AM

## 2014-11-03 NOTE — Progress Notes (Signed)
Patient has no acute event overnight. He rested well for most of the night. Patient was weaned back to room and patient tolerated the room air well without any distress. Patient denied pain and stated that he feels much better.

## 2014-11-03 NOTE — Care Management (Addendum)
Discharge order present.  Patient apparently demanded to be discharged home today.  Patient declined to stand/ walk for physical therapy yesterday, therefor the need to be able to navigate steps so patient could get into his home  Could not be addressed.  Home health SN PT and SW referral to [facility].  Requested face to face and script for wheelchair from attending.  Discussed the need to obtain room air exertional sats during progression.  Provided contact information for transportation assist to appointments.  Patient's wife now declines need for wheelchair

## 2014-11-03 NOTE — Discharge Instructions (Signed)
Home health Return to ER if worse  [place]   [place]

## 2014-11-28 ENCOUNTER — Emergency Department: Payer: Medicare Other

## 2014-11-28 ENCOUNTER — Encounter: Payer: Self-pay | Admitting: Intensive Care

## 2014-11-28 ENCOUNTER — Inpatient Hospital Stay
Admission: EM | Admit: 2014-11-28 | Discharge: 2014-12-02 | DRG: 280 | Disposition: A | Discharge status: Other Acute Inpatient Hospital | Payer: Medicare Other | Attending: Internal Medicine | Admitting: Internal Medicine

## 2014-11-28 DIAGNOSIS — Z825 Family history of asthma and other chronic lower respiratory diseases: Secondary | ICD-10-CM | POA: Diagnosis not present

## 2014-11-28 DIAGNOSIS — I10 Essential (primary) hypertension: Secondary | ICD-10-CM | POA: Diagnosis present

## 2014-11-28 DIAGNOSIS — Z85810 Personal history of malignant neoplasm of tongue: Secondary | ICD-10-CM | POA: Diagnosis not present

## 2014-11-28 DIAGNOSIS — J810 Acute pulmonary edema: Secondary | ICD-10-CM

## 2014-11-28 DIAGNOSIS — D638 Anemia in other chronic diseases classified elsewhere: Secondary | ICD-10-CM | POA: Diagnosis present

## 2014-11-28 DIAGNOSIS — E871 Hypo-osmolality and hyponatremia: Secondary | ICD-10-CM | POA: Diagnosis present

## 2014-11-28 DIAGNOSIS — Z885 Allergy status to narcotic agent status: Secondary | ICD-10-CM | POA: Diagnosis not present

## 2014-11-28 DIAGNOSIS — I340 Nonrheumatic mitral (valve) insufficiency: Secondary | ICD-10-CM | POA: Diagnosis present

## 2014-11-28 DIAGNOSIS — I4891 Unspecified atrial fibrillation: Secondary | ICD-10-CM | POA: Diagnosis present

## 2014-11-28 DIAGNOSIS — K2270 Barrett's esophagus without dysplasia: Secondary | ICD-10-CM | POA: Diagnosis present

## 2014-11-28 DIAGNOSIS — K219 Gastro-esophageal reflux disease without esophagitis: Secondary | ICD-10-CM | POA: Diagnosis present

## 2014-11-28 DIAGNOSIS — Z881 Allergy status to other antibiotic agents status: Secondary | ICD-10-CM | POA: Diagnosis not present

## 2014-11-28 DIAGNOSIS — M159 Polyosteoarthritis, unspecified: Secondary | ICD-10-CM | POA: Diagnosis present

## 2014-11-28 DIAGNOSIS — E1159 Type 2 diabetes mellitus with other circulatory complications: Secondary | ICD-10-CM | POA: Diagnosis present

## 2014-11-28 DIAGNOSIS — Z7951 Long term (current) use of inhaled steroids: Secondary | ICD-10-CM | POA: Diagnosis not present

## 2014-11-28 DIAGNOSIS — K760 Fatty (change of) liver, not elsewhere classified: Secondary | ICD-10-CM | POA: Diagnosis present

## 2014-11-28 DIAGNOSIS — M109 Gout, unspecified: Secondary | ICD-10-CM | POA: Diagnosis present

## 2014-11-28 DIAGNOSIS — Z85828 Personal history of other malignant neoplasm of skin: Secondary | ICD-10-CM

## 2014-11-28 DIAGNOSIS — Z96649 Presence of unspecified artificial hip joint: Secondary | ICD-10-CM | POA: Diagnosis present

## 2014-11-28 DIAGNOSIS — Z888 Allergy status to other drugs, medicaments and biological substances status: Secondary | ICD-10-CM

## 2014-11-28 DIAGNOSIS — Z86010 Personal history of colonic polyps: Secondary | ICD-10-CM | POA: Diagnosis not present

## 2014-11-28 DIAGNOSIS — J9600 Acute respiratory failure, unspecified whether with hypoxia or hypercapnia: Secondary | ICD-10-CM | POA: Diagnosis not present

## 2014-11-28 DIAGNOSIS — Z806 Family history of leukemia: Secondary | ICD-10-CM | POA: Diagnosis not present

## 2014-11-28 DIAGNOSIS — I214 Non-ST elevation (NSTEMI) myocardial infarction: Secondary | ICD-10-CM

## 2014-11-28 DIAGNOSIS — E785 Hyperlipidemia, unspecified: Secondary | ICD-10-CM | POA: Diagnosis present

## 2014-11-28 DIAGNOSIS — J811 Chronic pulmonary edema: Secondary | ICD-10-CM | POA: Diagnosis present

## 2014-11-28 DIAGNOSIS — Z7901 Long term (current) use of anticoagulants: Secondary | ICD-10-CM | POA: Diagnosis not present

## 2014-11-28 DIAGNOSIS — I509 Heart failure, unspecified: Secondary | ICD-10-CM

## 2014-11-28 DIAGNOSIS — I429 Cardiomyopathy, unspecified: Secondary | ICD-10-CM | POA: Diagnosis present

## 2014-11-28 DIAGNOSIS — H409 Unspecified glaucoma: Secondary | ICD-10-CM | POA: Diagnosis present

## 2014-11-28 DIAGNOSIS — Z79899 Other long term (current) drug therapy: Secondary | ICD-10-CM

## 2014-11-28 DIAGNOSIS — I2510 Atherosclerotic heart disease of native coronary artery without angina pectoris: Secondary | ICD-10-CM | POA: Diagnosis not present

## 2014-11-28 DIAGNOSIS — Z7982 Long term (current) use of aspirin: Secondary | ICD-10-CM | POA: Diagnosis not present

## 2014-11-28 DIAGNOSIS — J449 Chronic obstructive pulmonary disease, unspecified: Secondary | ICD-10-CM | POA: Diagnosis present

## 2014-11-28 DIAGNOSIS — Z87891 Personal history of nicotine dependence: Secondary | ICD-10-CM | POA: Diagnosis not present

## 2014-11-28 DIAGNOSIS — Z9849 Cataract extraction status, unspecified eye: Secondary | ICD-10-CM | POA: Diagnosis not present

## 2014-11-28 DIAGNOSIS — I5023 Acute on chronic systolic (congestive) heart failure: Principal | ICD-10-CM | POA: Diagnosis present

## 2014-11-28 DIAGNOSIS — Z8701 Personal history of pneumonia (recurrent): Secondary | ICD-10-CM

## 2014-11-28 DIAGNOSIS — E440 Moderate protein-calorie malnutrition: Secondary | ICD-10-CM | POA: Insufficient documentation

## 2014-11-28 DIAGNOSIS — I350 Nonrheumatic aortic (valve) stenosis: Secondary | ICD-10-CM | POA: Diagnosis present

## 2014-11-28 HISTORY — DX: Hyperlipidemia, unspecified: E78.5

## 2014-11-28 HISTORY — DX: Pneumonia, unspecified organism: J18.9

## 2014-11-28 HISTORY — DX: Polyosteoarthritis, unspecified: M15.9

## 2014-11-28 HISTORY — DX: Gout, unspecified: M10.9

## 2014-11-28 HISTORY — DX: Nonrheumatic aortic (valve) stenosis: I35.0

## 2014-11-28 HISTORY — DX: Fatty (change of) liver, not elsewhere classified: K76.0

## 2014-11-28 HISTORY — DX: Polyneuropathy, unspecified: G62.9

## 2014-11-28 HISTORY — DX: Basal cell carcinoma of skin, unspecified: C44.91

## 2014-11-28 HISTORY — DX: Unspecified atrial fibrillation: I48.91

## 2014-11-28 HISTORY — DX: Unspecified glaucoma: H40.9

## 2014-11-28 HISTORY — DX: Atherosclerotic heart disease of native coronary artery without angina pectoris: I25.10

## 2014-11-28 HISTORY — DX: Unspecified cataract: H26.9

## 2014-11-28 HISTORY — DX: Chronic obstructive pulmonary disease, unspecified: J44.9

## 2014-11-28 HISTORY — DX: Polyp of colon: K63.5

## 2014-11-28 HISTORY — DX: Other cervical disc degeneration, unspecified cervical region: M50.30

## 2014-11-28 HISTORY — DX: Barrett's esophagus without dysplasia: K22.70

## 2014-11-28 HISTORY — DX: Disease of blood and blood-forming organs, unspecified: D75.9

## 2014-11-28 HISTORY — DX: Malignant neoplasm of tongue, unspecified: C02.9

## 2014-11-28 HISTORY — DX: Other allergy status, other than to drugs and biological substances: Z91.09

## 2014-11-28 LAB — CBC
HCT: 31.1 % — ABNORMAL LOW (ref 40.0–52.0)
Hemoglobin: 9.9 g/dL — ABNORMAL LOW (ref 13.0–18.0)
MCH: 24.8 pg — ABNORMAL LOW (ref 26.0–34.0)
MCHC: 32 g/dL (ref 32.0–36.0)
MCV: 77.6 fL — ABNORMAL LOW (ref 80.0–100.0)
Platelets: 195 10*3/uL (ref 150–440)
RBC: 4.01 MIL/uL — ABNORMAL LOW (ref 4.40–5.90)
RDW: 17.1 % — ABNORMAL HIGH (ref 11.5–14.5)
WBC: 6.4 10*3/uL (ref 3.8–10.6)

## 2014-11-28 LAB — GLUCOSE, CAPILLARY
GLUCOSE-CAPILLARY: 157 mg/dL — AB (ref 65–99)
Glucose-Capillary: 147 mg/dL — ABNORMAL HIGH (ref 65–99)
Glucose-Capillary: 138 mg/dL — ABNORMAL HIGH (ref 65–99)

## 2014-11-28 LAB — COMPREHENSIVE METABOLIC PANEL
ALT: 14 U/L — ABNORMAL LOW (ref 17–63)
ANION GAP: 13 (ref 5–15)
AST: 45 U/L — AB (ref 15–41)
Albumin: 3.3 g/dL — ABNORMAL LOW (ref 3.5–5.0)
Alkaline Phosphatase: 68 U/L (ref 38–126)
BUN: 19 mg/dL (ref 6–20)
CO2: 29 mmol/L (ref 22–32)
Calcium: 8.8 mg/dL — ABNORMAL LOW (ref 8.9–10.3)
Chloride: 86 mmol/L — ABNORMAL LOW (ref 101–111)
Creatinine, Ser: 0.98 mg/dL (ref 0.61–1.24)
GFR calc non Af Amer: >60 mL/min (ref >60)
Glucose, Bld: 168 mg/dL — ABNORMAL HIGH (ref 65–99)
POTASSIUM: 3.9 mmol/L (ref 3.5–5.1)
Sodium: 128 mmol/L — ABNORMAL LOW (ref 135–145)
TOTAL PROTEIN: 7 g/dL (ref 6.5–8.1)
Total Bilirubin: 0.8 mg/dL (ref 0.3–1.2)

## 2014-11-28 LAB — HEPARIN LEVEL (UNFRACTIONATED): Heparin Unfractionated: 1.41 IU/mL — ABNORMAL HIGH (ref 0.30–0.70)

## 2014-11-28 LAB — PROTIME-INR
INR: 1.21
Prothrombin Time: 15.5 seconds — ABNORMAL HIGH (ref 11.4–15.0)

## 2014-11-28 LAB — BRAIN NATRIURETIC PEPTIDE: B NATRIURETIC PEPTIDE 5: 1557 pg/mL — AB (ref 0.0–100.0)

## 2014-11-28 LAB — APTT: aPTT: 38 seconds — ABNORMAL HIGH (ref 24–36)

## 2014-11-28 LAB — TROPONIN I
Troponin I: 2.93 ng/mL — ABNORMAL HIGH (ref <0.031)
Troponin I: 3.25 ng/mL — ABNORMAL HIGH (ref <0.031)
Troponin I: 4.52 ng/mL — ABNORMAL HIGH (ref <0.031)

## 2014-11-28 MED ORDER — FUROSEMIDE 10 MG/ML IJ SOLN
60.0000 mg | Freq: Once | INTRAMUSCULAR | Status: AC
  Administered 2014-11-28: 60 mg via INTRAVENOUS

## 2014-11-28 MED ORDER — ACETAMINOPHEN 650 MG RE SUPP: 650.0000 mg | Freq: Four times a day (QID) | RECTAL | Status: DC | PRN

## 2014-11-28 MED ORDER — FUROSEMIDE 10 MG/ML IJ SOLN
40.0000 mg | Freq: Two times a day (BID) | INTRAMUSCULAR | Status: DC
Start: 2014-11-28 — End: 2014-11-29
  Administered 2014-11-28 – 2014-11-29 (×2): 40 mg via INTRAVENOUS
  Filled 2014-11-28 (×2): qty 4

## 2014-11-28 MED ORDER — BUDESONIDE-FORMOTEROL FUMARATE 160-4.5 MCG/ACT IN AERO
2.0000 | INHALATION_SPRAY | Freq: Two times a day (BID) | RESPIRATORY_TRACT | Status: DC
  Administered 2014-11-28 – 2014-12-02 (×8): 2 via RESPIRATORY_TRACT
  Filled 2014-11-28 (×2): qty 6

## 2014-11-28 MED ORDER — SIMVASTATIN 20 MG PO TABS
20.0000 mg | ORAL_TABLET | Freq: Every day | ORAL | Status: DC
  Administered 2014-11-28 – 2014-12-01 (×4): 20 mg via ORAL
  Filled 2014-11-28 (×4): qty 1

## 2014-11-28 MED ORDER — HEPARIN (PORCINE) IN NACL 100-0.45 UNIT/ML-% IJ SOLN
1300.0000 [IU]/h | INTRAMUSCULAR | Status: DC
  Administered 2014-11-28 – 2014-11-29 (×2): 1200 [IU]/h via INTRAVENOUS
  Administered 2014-11-30 – 2014-12-01 (×2): 1300 [IU]/h via INTRAVENOUS
  Filled 2014-11-28 (×9): qty 250

## 2014-11-28 MED ORDER — INSULIN ASPART 100 UNIT/ML ~~LOC~~ SOLN
0.0000 [IU] | Freq: Three times a day (TID) | SUBCUTANEOUS | Status: DC
  Administered 2014-11-28: 2 [IU] via SUBCUTANEOUS
  Administered 2014-11-28: 1 [IU] via SUBCUTANEOUS
  Administered 2014-11-29 – 2014-11-30 (×2): 2 [IU] via SUBCUTANEOUS
  Administered 2014-12-01: 1 [IU] via SUBCUTANEOUS
  Filled 2014-11-28: qty 2
  Filled 2014-11-28 (×3): qty 1
  Filled 2014-11-28 (×3): qty 2

## 2014-11-28 MED ORDER — DOCUSATE SODIUM 100 MG PO CAPS
100.0000 mg | ORAL_CAPSULE | Freq: Two times a day (BID) | ORAL | Status: DC
  Administered 2014-11-28 – 2014-12-02 (×5): 100 mg via ORAL
  Filled 2014-11-28 (×8): qty 1

## 2014-11-28 MED ORDER — ONDANSETRON HCL 4 MG PO TABS: 4.0000 mg | ORAL_TABLET | Freq: Four times a day (QID) | ORAL | Status: DC | PRN

## 2014-11-28 MED ORDER — CENTRUM SILVER PO TABS: 1.0000 | ORAL_TABLET | Freq: Every day | ORAL | Status: DC

## 2014-11-28 MED ORDER — METOPROLOL SUCCINATE ER 25 MG PO TB24
25.0000 mg | ORAL_TABLET | Freq: Every day | ORAL | Status: DC
Start: 2014-11-28 — End: 2014-12-02
  Administered 2014-11-28: 25 mg via ORAL
  Filled 2014-11-28 (×3): qty 1

## 2014-11-28 MED ORDER — VITAMIN D 1000 UNITS PO TABS
2000.0000 [IU] | ORAL_TABLET | Freq: Every day | ORAL | Status: DC
  Administered 2014-11-28 – 2014-12-02 (×2): 2000 [IU] via ORAL
  Filled 2014-11-28 (×4): qty 2

## 2014-11-28 MED ORDER — ACETAMINOPHEN 325 MG PO TABS: 650.0000 mg | ORAL_TABLET | Freq: Four times a day (QID) | ORAL | Status: DC | PRN

## 2014-11-28 MED ORDER — FUROSEMIDE 10 MG/ML IJ SOLN
INTRAMUSCULAR | Status: AC
  Administered 2014-11-28: 60 mg via INTRAVENOUS
  Filled 2014-11-28: qty 10

## 2014-11-28 MED ORDER — PANTOPRAZOLE SODIUM 40 MG PO TBEC
40.0000 mg | DELAYED_RELEASE_TABLET | Freq: Every day | ORAL | Status: DC
  Administered 2014-11-28 – 2014-12-02 (×4): 40 mg via ORAL
  Filled 2014-11-28 (×4): qty 1

## 2014-11-28 MED ORDER — IPRATROPIUM BROMIDE 0.03 % NA SOLN
2.0000 | Freq: Two times a day (BID) | NASAL | Status: DC
  Administered 2014-12-02: 2 via NASAL
  Filled 2014-11-28 (×2): qty 30

## 2014-11-28 MED ORDER — SODIUM CHLORIDE 0.9 % IJ SOLN
3.0000 mL | Freq: Two times a day (BID) | INTRAMUSCULAR | Status: DC
  Administered 2014-11-28 – 2014-12-02 (×5): 3 mL via INTRAVENOUS

## 2014-11-28 MED ORDER — SODIUM CHLORIDE 0.9 % IV SOLN: 250.0000 mL | INTRAVENOUS | Status: DC | PRN

## 2014-11-28 MED ORDER — ADULT MULTIVITAMIN W/MINERALS CH
1.0000 | ORAL_TABLET | Freq: Every day | ORAL | Status: DC
  Administered 2014-12-02: 1 via ORAL
  Filled 2014-11-28 (×4): qty 1

## 2014-11-28 MED ORDER — ASPIRIN EC 81 MG PO TBEC
81.0000 mg | DELAYED_RELEASE_TABLET | Freq: Every day | ORAL | Status: DC
  Administered 2014-12-02: 81 mg via ORAL
  Filled 2014-11-28 (×4): qty 1

## 2014-11-28 MED ORDER — MONTELUKAST SODIUM 10 MG PO TABS
10.0000 mg | ORAL_TABLET | Freq: Every day | ORAL | Status: DC
  Administered 2014-11-28 – 2014-12-02 (×5): 10 mg via ORAL
  Filled 2014-11-28 (×5): qty 1

## 2014-11-28 MED ORDER — OMEGA-3-ACID ETHYL ESTERS 1 G PO CAPS
1000.0000 mg | ORAL_CAPSULE | Freq: Every day | ORAL | Status: DC
  Administered 2014-12-02: 1000 mg via ORAL
  Filled 2014-11-28 (×8): qty 1

## 2014-11-28 MED ORDER — SODIUM CHLORIDE 0.9 % IJ SOLN: 3.0000 mL | INTRAMUSCULAR | Status: DC | PRN

## 2014-11-28 MED ORDER — TIMOLOL MALEATE 0.5 % OP SOLN
1.0000 [drp] | Freq: Every day | OPHTHALMIC | Status: DC
  Administered 2014-12-02: 1 [drp] via OPHTHALMIC
  Filled 2014-11-28 (×2): qty 5

## 2014-11-28 MED ORDER — VITAMIN E 180 MG (400 UNIT) PO CAPS
400.0000 [IU] | ORAL_CAPSULE | Freq: Every day | ORAL | Status: DC
  Administered 2014-11-28 – 2014-12-02 (×2): 400 [IU] via ORAL
  Filled 2014-11-28 (×3): qty 1

## 2014-11-28 MED ORDER — GLIPIZIDE ER 10 MG PO TB24
10.0000 mg | ORAL_TABLET | Freq: Every day | ORAL | Status: DC
  Administered 2014-11-29 – 2014-12-02 (×2): 10 mg via ORAL
  Filled 2014-11-28 (×5): qty 1

## 2014-11-28 MED ORDER — ONDANSETRON HCL 4 MG/2ML IJ SOLN
4.0000 mg | Freq: Four times a day (QID) | INTRAMUSCULAR | Status: DC | PRN
  Filled 2014-11-28: qty 2

## 2014-11-28 NOTE — ED Notes (Signed)
Pt arrived by EMS from home. Pt was here on June 28th for pneumonia. Pt gets progressively SOB with exertion. Pt arrived on O2 2 L, pt does not wear O2 at home. HX diabetes, A fib, swelling in feet. Pt is on lasix for swelling. No pain, nausea, or vomiting. EMS vitals 82/40 initially, 106/72. FSBS 189

## 2014-11-28 NOTE — Progress Notes (Addendum)
ANTICOAGULATION CONSULT NOTE - Initial Consult  Pharmacy Consult for Heparin Indication: chest pain/ACS  Allergies  Allergen Reactions  . Cefdinir Diarrhea  . Cephalexin Diarrhea  . Codeine Nausea Only  . Doxycycline Nausea And Vomiting  . Tape Rash    Red rash     Patient Measurements: Height: 6\' 4"  (193 cm) Weight: 204 lb (92.534 kg) IBW/kg (Calculated) : 86.8 Heparin Dosing Weight: 92.5 kg  Vital Signs: Temp: 98.6 F (37 C) (07/03 0930) Temp Source: Oral (07/03 0930) BP: 108/75 mmHg (07/03 1000) Pulse Rate: 93 (07/03 1015)  Labs:  Recent Labs  11/28/14 0946  HGB 9.9*  HCT 31.1*  PLT 195  CREATININE 0.98  TROPONINI 2.93*    Estimated Creatinine Clearance: 72.6 mL/min (by C-G formula based on Cr of 0.98).   Medical History: Past Medical History  Diagnosis Date  . Diabetes mellitus without complication   . Hypertension   . Reflux   . CHF (congestive heart failure), NYHA class III 10/31/2014    Medications:   (Not in a hospital admission)  Assessment: Pt was on Eliquis 5 mg PO daily at home .  Goal of Therapy:  Heparin level 0.3-0.7 units/ml Monitor platelets by anticoagulation protocol: Yes   Plan:  Pt was on apixaban 5 mg PO daily at home.  Will not bolus this pt.  Start heparin infusion at 1200 units/hr. Will draw 1st HL 8 hrs after start of infusion on 7/3 @ 20:00.  Will draw aPTT on 7/3 @ 22:00.  Robbins,Jason D 11/28/2014,10:51 AM    7/3 anti-Xa 1.41.   7/4 01:00 aPTT 89. Continue current regimen and recheck tomorrow AM with anti-Xa.

## 2014-11-28 NOTE — ED Provider Notes (Signed)
[facility] Emergency Department Provider Note  ____________________________________________  Time seen: On arrival, via EMS  I have reviewed the triage vital signs and the nursing notes.   HISTORY  Chief Complaint Shortness of Breath    HPI [name] is a 81 y.o. male with an extensive list of medical history includingdiabetes coronary artery disease, congestive heart failure, atrial fibrillation presents with worsening shortness of breath over the last week. It is worse with lying flat and with any exertion. He denies chest pain. He denies cough. He denies fever. He notes he was admitted in the past for similar symptoms. He has noticed worsening lower edema bilaterally. No recent travel, he takes eliquis.     Past Medical History  Diagnosis Date  . Diabetes mellitus without complication   . Hypertension   . Reflux   . CHF (congestive heart failure), NYHA class III 10/31/2014    Patient Active Problem List   Diagnosis Date Noted  . Acute on chronic systolic and diastolic heart failure, NYHA class 3 10/31/2014  . CAP (community acquired pneumonia) 10/31/2014  . Atrial fibrillation 10/31/2014  . Hyponatremia 10/31/2014  . Anemia, chronic disease 10/31/2014  . Elevated troponin 10/31/2014  . CHF (congestive heart failure), NYHA class III 10/31/2014    History reviewed. No pertinent past surgical history.  Current Outpatient Rx  Name  Route  Sig  Dispense  Refill  . amoxicillin-clavulanate (AUGMENTIN) 875-125 MG per tablet   Oral   Take 1 tablet by mouth 2 (two) times daily.   20 tablet   0   . aspirin EC 81 MG EC tablet   Oral   Take 1 tablet (81 mg total) by mouth daily.   30 tablet   5   . B Complex Vitamins (VITAMIN B COMPLEX) TABS   Oral   Take 1 tablet by mouth daily.         . Cholecalciferol (VITAMIN D3) 2000 UNITS capsule   Oral   Take 2,000 Units by mouth daily.         Marland Kitchen docusate sodium (COLACE) 100 MG capsule  Oral   Take 1 capsule (100 mg total) by mouth 2 (two) times daily.   10 capsule   0   . ELIQUIS 5 MG TABS tablet   Oral   Take 5 mg by mouth daily.           Dispense as written.   . fluticasone (FLONASE) 50 MCG/ACT nasal spray   Nasal   Place 2 puffs into the nose daily.         . furosemide (LASIX) 40 MG tablet   Oral   Take 1 tablet (40 mg total) by mouth 2 (two) times daily.   60 tablet   5   . glipiZIDE (GLIPIZIDE XL) 10 MG 24 hr tablet   Oral   Take 1 tablet (10 mg total) by mouth daily with breakfast.   30 tablet   5   . glucose blood test strip   Apply externally   Apply 1 each topically 2 (two) times daily.         Marland Kitchen ipratropium (ATROVENT) 0.03 % nasal spray   Nasal   Place 2 sprays into the nose 3 (three) times daily as needed.      4   . montelukast (SINGULAIR) 10 MG tablet   Oral   Take 10 mg by mouth daily.          . Multiple  Vitamins-Minerals (CENTRUM SILVER) tablet   Oral   Take 1 tablet by mouth daily.         . Omega-3 Fatty Acids (EQL OMEGA 3 FISH OIL) 1200 MG CAPS   Oral   Take 1,200 mg by mouth daily.         Marland Kitchen omeprazole (PRILOSEC) 40 MG capsule   Oral   Take 40 mg by mouth daily.         . ondansetron (ZOFRAN) 4 MG tablet   Oral   Take 1 tablet (4 mg total) by mouth every 6 (six) hours as needed for nausea.   20 tablet   0   . potassium chloride (KLOR-CON) 20 MEQ packet   Oral   Take 20 mEq by mouth 2 (two) times daily.   60 tablet   5   . simvastatin (ZOCOR) 20 MG tablet   Oral   Take 20 mg by mouth at bedtime.         . SYMBICORT 160-4.5 MCG/ACT inhaler   Inhalation   Inhale 2 puffs into the lungs 2 (two) times daily.      12     Dispense as written.   . timolol (TIMOPTIC) 0.5 % ophthalmic solution   Both Eyes   Place 1 drop into both eyes daily.   10 mL   12   . TOPROL XL 25 MG 24 hr tablet   Oral   Take 25 mg by mouth daily.          . vitamin E 400 UNIT capsule   Oral   Take 400  Units by mouth daily.           Allergies Cefdinir; Cephalexin; Codeine; Doxycycline; and Tape  History reviewed. No pertinent family history.  Social History History  Substance Use Topics  . Smoking status: Former Smoker    Types: Cigarettes    Quit date: 10/30/1969  . Smokeless tobacco: Never Used  . Alcohol Use: No    Review of Systems  Constitutional: Negative for fever. Eyes: Negative for visual changes. ENT: Negative for sore throat Cardiovascular: Negative for chest pain. Respiratory: Positive for shortness of breath Gastrointestinal: Negative for abdominal pain, vomiting and diarrhea. Genitourinary: Negative for dysuria. Musculoskeletal: Positive for lower extremity swelling Skin: Negative for rash. Neurological: Negative for headaches Psychiatric: No anxiety or depression  10-point ROS otherwise negative.  ____________________________________________   PHYSICAL EXAM:  VITAL SIGNS: ED Triage Vitals  Enc Vitals Group     BP 11/28/14 0930 98/69 mmHg     Pulse Rate 11/28/14 0930 95     Resp 11/28/14 0930 19     Temp 11/28/14 0930 98.6 F (37 C)     Temp Source 11/28/14 0930 Oral     SpO2 11/28/14 0930 100 %     Weight 11/28/14 0930 204 lb (92.534 kg)     Height 11/28/14 0930 6\' 4"  (1.93 m)     Head Cir --      Peak Flow --      Pain Score --      Pain Loc --      Pain Edu? --      Excl. in [place]? --      Constitutional: Alert and oriented. Sitting up straight on stretcher Eyes: Conjunctivae are normal.  ENT   Head: Normocephalic and atraumatic.   Mouth/Throat: Mucous membranes are moist. Cardiovascular: Normal rate, regular rhythm. Normal and symmetric distal pulses are present in all extremities. No murmurs,  rubs, or gallops. Respiratory: Normal respiratory effort without tachypnea nor retractions. Breath sounds are clear and equal bilaterally. Nasal cannula in place Gastrointestinal: Soft and non-tender in all quadrants. No distention.  There is no CVA tenderness. Genitourinary: deferred Musculoskeletal: Nontender with normal range of motion in all extremities. 2+ edema bilaterally to the level of the mid tibia Neurologic:  Normal speech and language. No gross focal neurologic deficits are appreciated. Skin:  Skin is warm, dry and intact. No rash noted. Psychiatric: Mood and affect are normal. Patient exhibits appropriate insight and judgment.  ____________________________________________    LABS (pertinent positives/negatives)  Labs Reviewed  CBC - Abnormal; Notable for the following:    RBC 4.01 (*)    Hemoglobin 9.9 (*)    HCT 31.1 (*)    MCV 77.6 (*)    MCH 24.8 (*)    RDW 17.1 (*)    All other components within normal limits  COMPREHENSIVE METABOLIC PANEL - Abnormal; Notable for the following:    Sodium 128 (*)    Chloride 86 (*)    Glucose, Bld 168 (*)    Calcium 8.8 (*)    Albumin 3.3 (*)    AST 45 (*)    ALT 14 (*)    All other components within normal limits  BRAIN NATRIURETIC PEPTIDE - Abnormal; Notable for the following:    B Natriuretic Peptide 1557.0 (*)    All other components within normal limits  TROPONIN I - Abnormal; Notable for the following:    Troponin I 2.93 (*)    All other components within normal limits  CULTURE, BLOOD (ROUTINE X 2)  CULTURE, BLOOD (ROUTINE X 2)    ____________________________________________   EKG  ED ECG REPORT I, Lavonia Drafts, the attending physician, personally viewed and interpreted this ECG.   Date: 11/28/2014  EKG Time: 9:30 AM  Rate: 95  Rhythm: normal sinus rhythm  Axis: Normal axis  Intervals:Short PR interval  ST&T Change: Nonspecific   ____________________________________________    RADIOLOGY I have personally reviewed any xrays that were ordered on this patient: X-ray consistent with pulmonary edema bilaterally  ____________________________________________   PROCEDURES  Procedure(s) performed: none  Critical Care  performed: yes  CRITICAL CARE Performed by: Lavonia Drafts   Total critical care time: 30 min  Critical care time was exclusive of separately billable procedures and treating other patients.  Critical care was necessary to treat or prevent imminent or life-threatening deterioration.  Critical care was time spent personally by me on the following activities: development of treatment plan with patient and/or surrogate as well as nursing, discussions with consultants, evaluation of patient's response to treatment, examination of patient, obtaining history from patient or surrogate, ordering and performing treatments and interventions, ordering and review of laboratory studies, ordering and review of radiographic studies, pulse oximetry and re-evaluation of patient's condition.  ____________________________________________   INITIAL IMPRESSION / ASSESSMENT AND PLAN / ED COURSE  Pertinent labs & imaging results that were available during my care of the patient were reviewed by me and considered in my medical decision making (see chart for details).  Initial impression is that I suspect pulmonary edema/acute on chronic congestive heart failure although pneumonia is also on the differential. We will obtain chest x-ray blood work EKG and reevaluate. Review of last hospitalization demonstrates patient typically has blood pressures that are on the lower side.  ----------------------------------------- 10:28 AM on 11/28/2014 -----------------------------------------  Patient repeatedly complaining of shortness of breath despite pulse ox of 100% and heart rate  of 83. I have increased his nasal cannula oxygen.  chest x-ray concerning for significant pulmonary edema Give Lasix 60 mg IV and admit the patient.  ----------------------------------------- 10:34 AM on 11/28/2014 -----------------------------------------  Discussed with Dr. Clayborn Bigness of cardiology who recommends heparin initial 24 hours  given significantly elevated troponin ____________________________________________   FINAL CLINICAL IMPRESSION(S) / ED DIAGNOSES  Final diagnoses:  Acute pulmonary edema   NSTEMI   Lavonia Drafts, MD 11/28/14 1036

## 2014-11-28 NOTE — ED Notes (Signed)
Pt calling out multiple time with c/o SOB, pt 98-99% on RA, EDP placed pt on 3L then increased to 5L [place] for pt comfort O2 100%.Alec Kitchendenies any pain at present..unlabored breathing.[name]

## 2014-11-28 NOTE — ED Notes (Signed)
Attempted to call report. RN taking patient on floor is in room at this time and reports they will call this RN back.

## 2014-11-28 NOTE — H&P (Signed)
[facility] NAME: [name]    MR#:  [id number]  DATE OF BIRTH:  02/25/1932  DATE OF ADMISSION:  11/28/2014  PRIMARY CARE PHYSICIAN: Idelle Crouch, MD   REQUESTING/REFERRING PHYSICIAN: Lavonia Drafts M.D.  CHIEF COMPLAINT:   Chief Complaint  Patient presents with  . Shortness of Breath    HISTORY OF PRESENT ILLNESS: [name]  is a 81 y.o. male with a known history of multiple medical problems including history of systolic CHF based on his recent echo, as well as aortic stenosis, diabetes type 2, hypertension, hyperlipidemia, as well as history of coronary artery disease according to his past records however patient denies this history who presents with shortness of breath progressively worse. Patient reports that he's been having shortness of breath for a while but over the past few days is again worse any type of activity makes him very short of breath. He also states that he started having swelling and was started on Lasix but now his swelling has gotten worse. He also has to sleep propped up in a recliner. Patient came to the ED with the symptoms was noted to have CHF. He was also noticed to have elevated troponin consistent with possible non-ST MI. The ER physician spoke to Dr. Towanda Malkin who is on-call for  Cardiology who recommended heparinizing the patient and he will evaluate the patient later.  PAST MEDICAL HISTORY:   Past Medical History  Diagnosis Date  . Diabetes mellitus without complication   . Hypertension   . Reflux   . CHF (congestive heart failure), NYHA class III 10/31/2014  . Hyperlipemia   . Glaucoma   . Generalized OA   . Colon polyp   . Tongue cancer   . DDD (degenerative disc disease), cervical   . Cytopenia   . CAD (coronary artery disease)   . Environmental allergies   . Aortic stenosis   . Neuropathy   . Gout   . Cataract   . Basal cell carcinoma   . Fatty liver   . COPD (chronic  obstructive pulmonary disease)   . Barrett esophagus   . Atrial fibrillation   . Pneumonia     PAST SURGICAL HISTORY:  Past Surgical History  Procedure Laterality Date  . Hip surgery Left   . Tongue cancer resection    . Cataract extraction    . Hip arthroplasty Bilateral   . Esopageal radio frequency ablation      SOCIAL HISTORY:  History  Substance Use Topics  . Smoking status: Former Smoker    Types: Cigarettes    Quit date: 10/30/1969  . Smokeless tobacco: Never Used  . Alcohol Use: 0.0 oz/week    0 Standard drinks or equivalent per week     Comment: 8.4 oz weekly    FAMILY HISTORY:  Family History  Problem Relation Age of Onset  . Leukemia Father   . COPD Sister   . COPD Brother     DRUG ALLERGIES:  Allergies  Allergen Reactions  . Cefdinir Diarrhea  . Cephalexin Diarrhea  . Codeine Nausea Only  . Doxycycline Nausea And Vomiting  . Tape Rash    Red rash     REVIEW OF SYSTEMS:   CONSTITUTIONAL: No fever, positive fatigue and weakness.  EYES: No blurred or double vision.  EARS, NOSE, AND THROAT: No tinnitus or ear pain.  RESPIRATORY: Positive dry cough, positive shortness of breath, wheezing or hemoptysis.  CARDIOVASCULAR: No chest  pain, orthopnea, positive edema.  GASTROINTESTINAL: No nausea, vomiting, diarrhea or abdominal pain.  GENITOURINARY: No dysuria, hematuria.  ENDOCRINE: No polyuria, nocturia,  HEMATOLOGY: No anemia, easy bruising or bleeding SKIN: No rash or lesion. MUSCULOSKELETAL: No joint pain or arthritis.   NEUROLOGIC: No tingling, numbness, weakness.  PSYCHIATRY: No anxiety or depression.   MEDICATIONS AT HOME:  Prior to Admission medications   Medication Sig Start Date End Date Taking? Authorizing Provider  B Complex Vitamins (VITAMIN B COMPLEX) TABS Take 1 tablet by mouth daily. 06/02/12  Yes Historical Provider, MD  Cholecalciferol (VITAMIN D3) 2000 UNITS capsule Take 2,000 Units by mouth daily.   Yes Historical Provider, MD   docusate sodium (COLACE) 100 MG capsule Take 1 capsule (100 mg total) by mouth 2 (two) times daily. Patient taking differently: Take 100 mg by mouth 2 (two) times daily as needed for mild constipation or moderate constipation.  11/03/14  Yes Idelle Crouch, MD  ELIQUIS 5 MG TABS tablet Take 5 mg by mouth 2 (two) times daily.  10/27/14  Yes Historical Provider, MD  furosemide (LASIX) 40 MG tablet Take 1 tablet (40 mg total) by mouth 2 (two) times daily. Patient taking differently: Take 80 mg by mouth daily.  11/03/14  Yes Idelle Crouch, MD  glucose blood test strip Apply 1 each topically 2 (two) times daily. 08/23/14  Yes Historical Provider, MD  ipratropium (ATROVENT) 0.03 % nasal spray Place 2 sprays into the nose 3 (three) times daily as needed. 08/20/14  Yes Historical Provider, MD  montelukast (SINGULAIR) 10 MG tablet Take 10 mg by mouth daily.  04/30/13  Yes Historical Provider, MD  Multiple Vitamins-Minerals (CENTRUM SILVER) tablet Take 1 tablet by mouth daily. 06/02/12  Yes Historical Provider, MD  Omega-3 Fatty Acids (EQL OMEGA 3 FISH OIL) 1200 MG CAPS Take 1,200 mg by mouth daily. 06/02/12  Yes Historical Provider, MD  omeprazole (PRILOSEC) 40 MG capsule Take 40 mg by mouth daily. 12/15/13  Yes Historical Provider, MD  potassium chloride (KLOR-CON) 20 MEQ packet Take 20 mEq by mouth 2 (two) times daily. Patient taking differently: Take 20 mEq by mouth daily.  11/03/14  Yes Idelle Crouch, MD  simvastatin (ZOCOR) 20 MG tablet Take 20 mg by mouth at bedtime. 08/19/14 08/19/15 Yes Historical Provider, MD  SYMBICORT 160-4.5 MCG/ACT inhaler Inhale 2 puffs into the lungs 2 (two) times daily. 10/10/14  Yes Historical Provider, MD  TOPROL XL 25 MG 24 hr tablet Take 25 mg by mouth daily.  04/28/13  Yes Historical Provider, MD  vitamin E 400 UNIT capsule Take 400 Units by mouth daily.   Yes Historical Provider, MD  amoxicillin-clavulanate (AUGMENTIN) 875-125 MG per tablet Take 1 tablet by mouth 2 (two) times  daily. 11/03/14   Idelle Crouch, MD  aspirin EC 81 MG EC tablet Take 1 tablet (81 mg total) by mouth daily. 11/03/14   Idelle Crouch, MD  glipiZIDE (GLIPIZIDE XL) 10 MG 24 hr tablet Take 1 tablet (10 mg total) by mouth daily with breakfast. 11/03/14   Idelle Crouch, MD  ondansetron (ZOFRAN) 4 MG tablet Take 1 tablet (4 mg total) by mouth every 6 (six) hours as needed for nausea. 11/03/14   Idelle Crouch, MD  timolol (TIMOPTIC) 0.5 % ophthalmic solution Place 1 drop into both eyes daily. 11/03/14   Idelle Crouch, MD      PHYSICAL EXAMINATION:   VITAL SIGNS: Blood pressure 108/75, pulse 93, temperature 98.6 F (37 C),  temperature source Oral, resp. rate 27, height 6\' 4"  (1.93 m), weight 92.534 kg (204 lb), SpO2 100 %.  GENERAL:  81 y.o.-year-old patient lying in the bed with no acute distress.  EYES: Pupils equal, round, reactive to light and accommodation. No scleral icterus. Extraocular muscles intact.  HEENT: Head atraumatic, normocephalic. Oropharynx and nasopharynx clear.  NECK:  Supple, no jugular venous distention. No thyroid enlargement, no tenderness.  LUNGS: Bilateral crackles at the bases, no wheezing no sensory muscle usage  CARDIOVASCULAR: S1, S2 normal. No murmurs, rubs, or gallops.  ABDOMEN: Soft, nontender, nondistended. Bowel sounds present. No organomegaly or mass.  EXTREMITIES: 2+ pedal edema, cyanosis, or clubbing.  NEUROLOGIC: Cranial nerves II through XII are intact. Muscle strength 5/5 in all extremities. Sensation intact. Gait not checked.  PSYCHIATRIC: The patient is alert and oriented x 3.  SKIN: No obvious rash, lesion, or ulcer.   LABORATORY PANEL:   CBC  Recent Labs Lab 11/28/14 0946  WBC 6.4  HGB 9.9*  HCT 31.1*  PLT 195  MCV 77.6*  MCH 24.8*  MCHC 32.0  RDW 17.1*   ------------------------------------------------------------------------------------------------------------------  Chemistries   Recent Labs Lab 11/28/14 0946  NA 128*  K  3.9  CL 86*  CO2 29  GLUCOSE 168*  BUN 19  CREATININE 0.98  CALCIUM 8.8*  AST 45*  ALT 14*  ALKPHOS 68  BILITOT 0.8   ------------------------------------------------------------------------------------------------------------------ estimated creatinine clearance is 72.6 mL/min (by C-G formula based on Cr of 0.98). ------------------------------------------------------------------------------------------------------------------ No results for input(s): TSH, T4TOTAL, T3FREE, THYROIDAB in the last 72 hours.  Invalid input(s): FREET3   Coagulation profile No results for input(s): INR, PROTIME in the last 168 hours. ------------------------------------------------------------------------------------------------------------------- No results for input(s): DDIMER in the last 72 hours. -------------------------------------------------------------------------------------------------------------------  Cardiac Enzymes  Recent Labs Lab 11/28/14 0946  TROPONINI 2.93*   ------------------------------------------------------------------------------------------------------------------ Invalid input(s): POCBNP  ---------------------------------------------------------------------------------------------------------------  Urinalysis No results found for: COLORURINE, APPEARANCEUR, LABSPEC, PHURINE, GLUCOSEU, HGBUR, BILIRUBINUR, KETONESUR, PROTEINUR, UROBILINOGEN, NITRITE, LEUKOCYTESUR   RADIOLOGY: Dg Chest Portable 1 View  11/28/2014   CLINICAL DATA:  Short of breath. Progressive shortness of breath with exertion.  EXAM: PORTABLE CHEST - 1 VIEW  COMPARISON:  11/02/2014.  10/26/2014.  FINDINGS: Cardiopericardial silhouette borderline for projection. Aortic arch atherosclerosis.  Perihilar and basilar predominant airspace disease is present, most compatible with pulmonary edema. Multifocal pneumonia is in the differential considerations. Given the prior CT scans, some of this may represent  post infectious/inflammatory changes or ARDS. Small bilateral pleural effusions are present with blunting of both costophrenic angles.  When compared to chest radiograph 11/01/2014, pulmonary aeration is improved.  IMPRESSION: Bilateral basilar and perihilar predominant airspace disease most compatible with pulmonary edema. This is probably superimposed on chronic interstitial and airspace opacities identified on prior exams. Recurrent/progressive Multifocal pneumonia is considered unlikely, particularly given the improvement from prior chest radiograph and CT.   Electronically Signed   By: Dereck Ligas M.D.   On: 11/28/2014 10:09    EKG: Orders placed or performed during the hospital encounter of 11/28/14  . ED EKG  . ED EKG    IMPRESSION AND PLAN: Patient is a 81 year old white male with multiple medical problems presents with progressive shortness of breath  1. Acute respiratory failure: Due to acute systolic CHF at this time will treat patient with IV Lasix, since he had a recent echocardiogram done last month I will not repeat his echocardiogram. We'll ask cardiology to evaluate the patient 2. Elevated troponin: Suspect due to non-ST MI, Dr. Towanda Malkin  has been notified by the ED physician and he recommends IV heparin and he will evaluate the patient. We'll trend his cardiac enzymes, continue Toprol-XL 3. Diabetes type 2; low-dose sliding scale patient will be continued on glipizide 4. History of COPD continue Symbicort, no evidence of exasperation 5. History of atrial fibrillation Eliquis will be held due to IV heparin:  6. Aortic stenosis: Per cardiology 7. Glaucoma continue eyedrops as taking at home 8. Code: Full   All the records are reviewed and case discussed with ED provider. Management plans discussed with the patient, family and they are in agreement.  CODE STATUS: Advance Directive Documentation        Most Recent Value   Type of Advance Directive  Healthcare Power of  Attorney, Living will   Pre-existing out of facility DNR order (yellow form or pink MOST form)     "MOST" Form in Place?         TOTAL TIME TAKING CARE OF THIS PATIENT: 70 minutes   Dustin Flock M.D on 11/28/2014 at 11:17 AM  Between 7am to 6pm - Pager - [phone]  After 6pm go to [email or web address] - password [email or web address]  Office  [phone]  CC: Primary care physician; Idelle Crouch, MD

## 2014-11-28 NOTE — Consult Note (Addendum)
Reason for Consult: Congestive Heart Failure ,elevated troponin Referring Physician:  Dr.  Serita Grit  JASANI DOLNEY is an 81 y.o. male.  HPI:  Patient is a 81 year old multiple medical problems systolic dysfunction recent echo with ejection fraction of around 40% there is some aortic stenosis diabetes hypertension hyperlipidemia known history of coronary disease. Patient reportedly had a catheterization done about a year ago by Dr. Saralyn Pilar which showed mild to moderate coronary disease. Patient denies much in the way of chest pain just complains of shortness of breath dyspnea feeling weak mild leg edema so finally came to emergency room was found have elevated troponins so was admitted for further evaluation care. Patient denies syncope black or spell palpitations or tachycardia.  Past Medical History  Diagnosis Date  . Diabetes mellitus without complication   . Hypertension   . Reflux   . CHF (congestive heart failure), NYHA class III 10/31/2014  . Hyperlipemia   . Glaucoma   . Generalized OA   . Colon polyp   . Tongue cancer   . DDD (degenerative disc disease), cervical   . Cytopenia   . CAD (coronary artery disease)   . Environmental allergies   . Aortic stenosis   . Neuropathy   . Gout   . Cataract   . Basal cell carcinoma   . Fatty liver   . COPD (chronic obstructive pulmonary disease)   . Barrett esophagus   . Atrial fibrillation   . Pneumonia     Past Surgical History  Procedure Laterality Date  . Hip surgery Left   . Tongue cancer resection    . Cataract extraction    . Hip arthroplasty Bilateral   . Esopageal radio frequency ablation      Family History  Problem Relation Age of Onset  . Leukemia Father   . COPD Sister   . COPD Brother     Social History:  reports that he quit smoking about 45 years ago. His smoking use included Cigarettes. He has never used smokeless tobacco. He reports that he drinks alcohol. He reports that he does not use illicit  drugs.  Allergies:  Allergies  Allergen Reactions  . Cefdinir Diarrhea  . Cephalexin Diarrhea  . Codeine Nausea Only  . Doxycycline Nausea And Vomiting  . Tape Rash    Red rash     Medications:  Prior to Admission:  Prescriptions prior to admission  Medication Sig Dispense Refill Last Dose  . B Complex Vitamins (VITAMIN B COMPLEX) TABS Take 1 tablet by mouth daily.   11/27/2014 at Unknown time  . Cholecalciferol (VITAMIN D3) 2000 UNITS capsule Take 2,000 Units by mouth daily.   11/27/2014 at Unknown time  . docusate sodium (COLACE) 100 MG capsule Take 1 capsule (100 mg total) by mouth 2 (two) times daily. (Patient taking differently: Take 100 mg by mouth 2 (two) times daily as needed for mild constipation or moderate constipation. ) 10 capsule 0 11/27/2014 at Unknown time  . ELIQUIS 5 MG TABS tablet Take 5 mg by mouth 2 (two) times daily.    11/27/2014 at Unknown time  . furosemide (LASIX) 40 MG tablet Take 1 tablet (40 mg total) by mouth 2 (two) times daily. (Patient taking differently: Take 80 mg by mouth daily. ) 60 tablet 5 11/27/2014 at Unknown time  . glucose blood test strip Apply 1 each topically 2 (two) times daily.   11/27/2014 at Unknown time  . ipratropium (ATROVENT) 0.03 % nasal spray Place 2 sprays  into the nose 3 (three) times daily as needed.  4 11/27/2014 at Unknown time  . montelukast (SINGULAIR) 10 MG tablet Take 10 mg by mouth daily.    11/27/2014 at Unknown time  . Multiple Vitamins-Minerals (CENTRUM SILVER) tablet Take 1 tablet by mouth daily.   11/27/2014 at Unknown time  . Omega-3 Fatty Acids (EQL OMEGA 3 FISH OIL) 1200 MG CAPS Take 1,200 mg by mouth daily.   11/27/2014 at Unknown time  . omeprazole (PRILOSEC) 40 MG capsule Take 40 mg by mouth daily.   11/27/2014 at Unknown time  . potassium chloride (KLOR-CON) 20 MEQ packet Take 20 mEq by mouth 2 (two) times daily. (Patient taking differently: Take 20 mEq by mouth daily. ) 60 tablet 5 11/27/2014 at Unknown time  . simvastatin (ZOCOR) 20  MG tablet Take 20 mg by mouth at bedtime.   11/27/2014 at Unknown time  . SYMBICORT 160-4.5 MCG/ACT inhaler Inhale 2 puffs into the lungs 2 (two) times daily.  12 11/27/2014 at Unknown time  . TOPROL XL 25 MG 24 hr tablet Take 25 mg by mouth daily.    11/27/2014 at 0900  . vitamin E 400 UNIT capsule Take 400 Units by mouth daily.   11/27/2014  . amoxicillin-clavulanate (AUGMENTIN) 875-125 MG per tablet Take 1 tablet by mouth 2 (two) times daily. 20 tablet 0   . aspirin EC 81 MG EC tablet Take 1 tablet (81 mg total) by mouth daily. 30 tablet 5   . glipiZIDE (GLIPIZIDE XL) 10 MG 24 hr tablet Take 1 tablet (10 mg total) by mouth daily with breakfast. 30 tablet 5   . ondansetron (ZOFRAN) 4 MG tablet Take 1 tablet (4 mg total) by mouth every 6 (six) hours as needed for nausea. 20 tablet 0   . timolol (TIMOPTIC) 0.5 % ophthalmic solution Place 1 drop into both eyes daily. 10 mL 12     Results for orders placed or performed during the hospital encounter of 11/28/14 (from the past 48 hour(s))  CBC     Status: Abnormal   Collection Time: 11/28/14  9:46 AM  Result Value Ref Range   WBC 6.4 3.8 - 10.6 K/uL   RBC 4.01 (L) 4.40 - 5.90 MIL/uL   Hemoglobin 9.9 (L) 13.0 - 18.0 g/dL   HCT 31.1 (L) 40.0 - 52.0 %   MCV 77.6 (L) 80.0 - 100.0 fL   MCH 24.8 (L) 26.0 - 34.0 pg   MCHC 32.0 32.0 - 36.0 g/dL   RDW 17.1 (H) 11.5 - 14.5 %   Platelets 195 150 - 440 K/uL  Comprehensive metabolic panel     Status: Abnormal   Collection Time: 11/28/14  9:46 AM  Result Value Ref Range   Sodium 128 (L) 135 - 145 mmol/L   Potassium 3.9 3.5 - 5.1 mmol/L   Chloride 86 (L) 101 - 111 mmol/L   CO2 29 22 - 32 mmol/L   Glucose, Bld 168 (H) 65 - 99 mg/dL   BUN 19 6 - 20 mg/dL   Creatinine, Ser 0.98 0.61 - 1.24 mg/dL   Calcium 8.8 (L) 8.9 - 10.3 mg/dL   Total Protein 7.0 6.5 - 8.1 g/dL   Albumin 3.3 (L) 3.5 - 5.0 g/dL   AST 45 (H) 15 - 41 U/L   ALT 14 (L) 17 - 63 U/L   Alkaline Phosphatase 68 38 - 126 U/L   Total Bilirubin 0.8  0.3 - 1.2 mg/dL   GFR calc non Af Amer >60 >  60 mL/min   GFR calc Af Amer >60 >60 mL/min    Comment: (NOTE) The eGFR has been calculated using the CKD EPI equation. This calculation has not been validated in all clinical situations. eGFR's persistently <60 mL/min signify possible Chronic Kidney Disease.    Anion gap 13 5 - 15  Brain natriuretic peptide     Status: Abnormal   Collection Time: 11/28/14  9:46 AM  Result Value Ref Range   B Natriuretic Peptide 1557.0 (H) 0.0 - 100.0 pg/mL  Troponin I     Status: Abnormal   Collection Time: 11/28/14  9:46 AM  Result Value Ref Range   Troponin I 2.93 (H) <0.031 ng/mL    Comment: READ BACK AND VERIFIED WITH STEPHANIE RUDD AT 1019 ON 11/28/14 BY [facility]        POSSIBLE MYOCARDIAL ISCHEMIA. SERIAL TESTING RECOMMENDED.   Protime-INR     Status: Abnormal   Collection Time: 11/28/14  9:46 AM  Result Value Ref Range   Prothrombin Time 15.5 (H) 11.4 - 15.0 seconds   INR 1.21   APTT     Status: Abnormal   Collection Time: 11/28/14  9:46 AM  Result Value Ref Range   aPTT 38 (H) 24 - 36 seconds    Comment:        IF BASELINE aPTT IS ELEVATED, SUGGEST PATIENT RISK ASSESSMENT BE USED TO DETERMINE APPROPRIATE ANTICOAGULANT THERAPY.   Troponin I     Status: Abnormal   Collection Time: 11/28/14 12:40 PM  Result Value Ref Range   Troponin I 3.25 (H) <0.031 ng/mL    Comment: RESULTS PREVIOUSLY CALLED ON 11/28/14 AT 1019        POSSIBLE MYOCARDIAL ISCHEMIA. SERIAL TESTING RECOMMENDED.   Glucose, capillary     Status: Abnormal   Collection Time: 11/28/14  1:21 PM  Result Value Ref Range   Glucose-Capillary 157 (H) 65 - 99 mg/dL  Glucose, capillary     Status: Abnormal   Collection Time: 11/28/14  4:34 PM  Result Value Ref Range   Glucose-Capillary 147 (H) 65 - 99 mg/dL   Comment 1 Notify RN   Troponin I     Status: Abnormal   Collection Time: 11/28/14  6:20 PM  Result Value Ref Range   Troponin I 4.52 (H) <0.031 ng/mL    Comment: RESULTS  PREVIOUSLY CALLED TO STEPHANIE RUDD ON 11/28/14 AT 1019 BY [facility]        POSSIBLE MYOCARDIAL ISCHEMIA. SERIAL TESTING RECOMMENDED.   Heparin level (unfractionated)     Status: Abnormal   Collection Time: 11/28/14  6:20 PM  Result Value Ref Range   Heparin Unfractionated 1.41 (H) 0.30 - 0.70 IU/mL    Comment:        IF HEPARIN RESULTS ARE BELOW EXPECTED VALUES, AND PATIENT DOSAGE HAS BEEN CONFIRMED, SUGGEST FOLLOW UP TESTING OF ANTITHROMBIN III LEVELS.   Glucose, capillary     Status: Abnormal   Collection Time: 11/28/14  8:18 PM  Result Value Ref Range   Glucose-Capillary 138 (H) 65 - 99 mg/dL    Dg Chest Portable 1 View  11/28/2014   CLINICAL DATA:  Short of breath. Progressive shortness of breath with exertion.  EXAM: PORTABLE CHEST - 1 VIEW  COMPARISON:  11/02/2014.  10/26/2014.  FINDINGS: Cardiopericardial silhouette borderline for projection. Aortic arch atherosclerosis.  Perihilar and basilar predominant airspace disease is present, most compatible with pulmonary edema. Multifocal pneumonia is in the differential considerations. Given the prior CT scans, some of  this may represent post infectious/inflammatory changes or ARDS. Small bilateral pleural effusions are present with blunting of both costophrenic angles.  When compared to chest radiograph 11/01/2014, pulmonary aeration is improved.  IMPRESSION: Bilateral basilar and perihilar predominant airspace disease most compatible with pulmonary edema. This is probably superimposed on chronic interstitial and airspace opacities identified on prior exams. Recurrent/progressive Multifocal pneumonia is considered unlikely, particularly given the improvement from prior chest radiograph and CT.   Electronically Signed   By: Dereck Ligas M.D.   On: 11/28/2014 10:09    Review of Systems  Constitutional: Positive for malaise/fatigue.  HENT: Positive for congestion.   Eyes: Negative.   Respiratory: Positive for shortness of breath.    Cardiovascular: Positive for orthopnea, leg swelling and PND.  Gastrointestinal: Negative.   Genitourinary: Negative.   Musculoskeletal: Negative.   Neurological: Positive for weakness.  Endo/Heme/Allergies: Negative.   Psychiatric/Behavioral: Negative.    Blood pressure 103/73, pulse 90, temperature 97.5 F (36.4 C), temperature source Oral, resp. rate 23, height _0  (1.93 m), weight 92.534 kg (204 lb), SpO2 100 %. Physical Exam  Constitutional: He is oriented to person, place, and time. He appears well-developed and well-nourished.  HENT:  Head: Normocephalic and atraumatic.  Eyes: EOM are normal. Pupils are equal, round, and reactive to light.  Neck: Normal range of motion. Neck supple.  Cardiovascular: Normal rate.  An irregularly irregular rhythm present. Exam reveals gallop, S3 and S4.   Murmur heard.  Systolic murmur is present with a grade of 2/6  Respiratory: Effort normal and breath sounds normal.  GI: Soft. Bowel sounds are normal.  Musculoskeletal: Normal range of motion. He exhibits edema.  Neurological: He is alert and oriented to person, place, and time. He has normal reflexes.  Skin: Skin is warm and dry.    Assessment/Plan:  shortness of breath  Congestive Heart Failure  elevated troponin  demand ischemia  diabetes  Chronic obstructive pulmonary disease  atrial fibrillation  hyperlipidemia  weakness  history of aortic stenosis .  PLAN  agree with admit for rule out myocardial infarction  follow up cardiac enzymes and troponins  continue treatment for atrial fibrillation control  anticoagulation for atrial fibrillation  Lasix therapy for Congestive Heart Failure  continue diabetes management control  statin therapy for hyperlipidemia  Protonix for GERD symptoms  consider echocardiogram for Congestive Heart Failure aortic stenosis  do not recommend cardiac catheterization     Jader Desai D. 11/28/2014, 9:59 PM

## 2014-11-29 ENCOUNTER — Inpatient Hospital Stay
Admit: 2014-11-29 | Discharge: 2014-11-29 | Disposition: A | Discharge status: Home / Self Care | Payer: Medicare Other | Attending: Infectious Diseases | Admitting: Infectious Diseases

## 2014-11-29 DIAGNOSIS — E440 Moderate protein-calorie malnutrition: Secondary | ICD-10-CM | POA: Insufficient documentation

## 2014-11-29 LAB — GLUCOSE, CAPILLARY
GLUCOSE-CAPILLARY: 152 mg/dL — AB (ref 65–99)
GLUCOSE-CAPILLARY: 123 mg/dL — AB (ref 65–99)
Glucose-Capillary: 171 mg/dL — ABNORMAL HIGH (ref 65–99)
Glucose-Capillary: 134 mg/dL — ABNORMAL HIGH (ref 65–99)

## 2014-11-29 LAB — APTT
APTT: 89 s — AB (ref 24–36)
APTT: 95 s — AB (ref 24–36)
aPTT: 50 seconds — ABNORMAL HIGH (ref 24–36)

## 2014-11-29 LAB — BASIC METABOLIC PANEL
ANION GAP: 12 (ref 5–15)
BUN: 21 mg/dL — ABNORMAL HIGH (ref 6–20)
CO2: 29 mmol/L (ref 22–32)
CREATININE: 0.91 mg/dL (ref 0.61–1.24)
Calcium: 8.7 mg/dL — ABNORMAL LOW (ref 8.9–10.3)
Chloride: 88 mmol/L — ABNORMAL LOW (ref 101–111)
GFR calc Af Amer: >60 mL/min (ref >60)
GFR calc non Af Amer: >60 mL/min (ref >60)
GLUCOSE: 149 mg/dL — AB (ref 65–99)
Potassium: 3.5 mmol/L (ref 3.5–5.1)
Sodium: 129 mmol/L — ABNORMAL LOW (ref 135–145)

## 2014-11-29 LAB — CBC
HCT: 29.4 % — ABNORMAL LOW (ref 40.0–52.0)
HEMOGLOBIN: 9.5 g/dL — AB (ref 13.0–18.0)
MCH: 24.8 pg — ABNORMAL LOW (ref 26.0–34.0)
MCHC: 32.2 g/dL (ref 32.0–36.0)
MCV: 77 fL — AB (ref 80.0–100.0)
PLATELETS: 178 10*3/uL (ref 150–440)
RBC: 3.81 MIL/uL — ABNORMAL LOW (ref 4.40–5.90)
RDW: 17.2 % — AB (ref 11.5–14.5)
WBC: 6.2 10*3/uL (ref 3.8–10.6)

## 2014-11-29 LAB — HEPARIN LEVEL (UNFRACTIONATED): HEPARIN UNFRACTIONATED: 1 [IU]/mL — AB (ref 0.30–0.70)

## 2014-11-29 LAB — TROPONIN I: Troponin I: 6.68 ng/mL — ABNORMAL HIGH (ref <0.031)

## 2014-11-29 MED ORDER — IPRATROPIUM-ALBUTEROL 0.5-2.5 (3) MG/3ML IN SOLN
3.0000 mL | RESPIRATORY_TRACT | Status: DC | PRN
  Administered 2014-11-29 – 2014-12-01 (×4): 3 mL via RESPIRATORY_TRACT
  Filled 2014-11-29 (×4): qty 3

## 2014-11-29 MED ORDER — FUROSEMIDE 10 MG/ML IJ SOLN
80.0000 mg | Freq: Two times a day (BID) | INTRAMUSCULAR | Status: DC
  Administered 2014-11-29 – 2014-12-01 (×5): 80 mg via INTRAVENOUS
  Filled 2014-11-29 (×6): qty 8

## 2014-11-29 NOTE — Progress Notes (Signed)
Aide took vitals Q8 hours today as ordered, however the vitals did not flow over into the flowsheets. Several attempts were made by myself and the aide to recover vitals from other dynamaps, with no success. Aide has obtained a replacement set of vitals that are confirmed to have flowed over into the flowsheets.

## 2014-11-29 NOTE — Progress Notes (Signed)
Pt has been c/o SOB, O2 sats @ 100% on 4L [place]. Demanding to see a Dr., MD Ola Spurr notified. Order for Duoneb 71ml Q 4hrs PRN received.

## 2014-11-29 NOTE — Progress Notes (Signed)
Asked Dr. Rockey Situ, who was on the floor at the time, about questionable tele strip. Dr. Rockey Situ believes rhythm to be 2nd degree HB Type I, not a.fib or junctional.

## 2014-11-29 NOTE — Care Management (Signed)
IM given °

## 2014-11-29 NOTE — Progress Notes (Signed)
ANTICOAGULATION CONSULT NOTE - Follow up   Pharmacy Consult for Heparin Indication: chest pain/ACS  Allergies  Allergen Reactions  . Cefdinir Diarrhea  . Cephalexin Diarrhea  . Codeine Nausea Only  . Doxycycline Nausea And Vomiting  . Tape Rash    Red rash     Patient Measurements: Height: 6\' 4"  (193 cm) Weight: 204 lb (92.534 kg) IBW/kg (Calculated) : 86.8 Heparin Dosing Weight: 92.5 kg  Vital Signs: Temp: 97.4 F (36.3 C) (07/04 1803) Temp Source: Oral (07/04 1803) BP: 93/64 mmHg (07/04 1803) Pulse Rate: 86 (07/04 1803)  Labs:  Recent Labs  11/28/14 0946 11/28/14 1240 11/28/14 1820 11/29/14 0115 11/29/14 0536 11/29/14 1025 11/29/14 1826  HGB 9.9*  --   --   --  9.5*  --   --   HCT 31.1*  --   --   --  29.4*  --   --   PLT 195  --   --   --  178  --   --   APTT 38*  --   --  89*  --  50* 95*  LABPROT 15.5*  --   --   --   --   --   --   INR 1.21  --   --   --   --   --   --   HEPARINUNFRC  --   --  1.41*  --   --  1.00*  --   CREATININE 0.98  --   --   --  0.91  --   --   TROPONINI 2.93* 3.25* 4.52* 6.68*  --   --   --     Estimated Creatinine Clearance: 78.2 mL/min (by C-G formula based on Cr of 0.91).   Medications:  Assessment: Pt was on Eliquis 5 mg PO daily at home, now on heparin drip. Dosing by aPTT until values correlate with heparin level.   Goal of Therapy:  Heparin level 0.3-0.7 units/ml, Target aPTT 66-102s   Plan:  Current orders for heparin 1200 units/hr. aPTT subtherapeutic, HL remains elevated due to prior use of apixaban.   Will increase heparin to 1300 units/hr and recheck aPTT in 6 hours.   Will continue to check aPTT until values correlate with heparin level (66-102s equivalent to HL 0.3-0.7). HL/aPTT ordered with AM labs.   7/4: APTT within normal range of 95. Will recheck APTT and HL with am labs.   Larene Beach, PharmD Clinical Pharmacist   11/29/2014,6:57 PM

## 2014-11-29 NOTE — Progress Notes (Signed)
[facility]  PROGRESS NOTE Date of Admission:  11/28/2014     ID: [name] is a 81 y.o. male with SOB  Active Problems:   Acute CHF  Subjective: Still miserable - cant move without DOE - on 4 L. Says very little UOP.    ROS  Eleven systems are reviewed and negative except per hpi  Medications:  Antibiotics Given (last 72 hours)    None     . aspirin EC  81 mg Oral Daily  . budesonide-formoterol  2 puff Inhalation BID  . cholecalciferol  2,000 Units Oral Daily  . docusate sodium  100 mg Oral BID  . furosemide  40 mg Intravenous BID  . glipiZIDE  10 mg Oral Q breakfast  . insulin aspart  0-9 Units Subcutaneous TID WC  . ipratropium  2 spray Nasal BID  . metoprolol succinate  25 mg Oral Daily  . montelukast  10 mg Oral Daily  . multivitamin with minerals  1 tablet Oral Daily  . omega-3 acid ethyl esters  1,000 mg Oral Daily  . pantoprazole  40 mg Oral Daily  . simvastatin  20 mg Oral QHS  . sodium chloride  3 mL Intravenous Q12H  . timolol  1 drop Both Eyes Daily  . vitamin E  400 Units Oral Daily    Objective: Vital signs in last 24 hours: Temp:  [97.5 F (36.4 C)-97.6 F (36.4 C)] 97.6 F (36.4 C) (07/04 0407) Pulse Rate:  [86-90] 86 (07/04 0409) Resp:  [22-23] 22 (07/04 0407) BP: (90-108)/(54-73) 108/61 mmHg (07/04 0409) SpO2:  [100 %] 100 % (07/04 0409) GENERAL: 81 y.o.-year-old patient lying in the bed with no acute distress.  EYES: Pupils equal, round, reactive to light and accommodation. No scleral icterus. Extraocular muscles intact.  HEENT: Head atraumatic, normocephalic. Oropharynx and nasopharynx clear.  NECK: Supple, no jugular venous distention. No thyroid enlargement, no tenderness.  LUNGS: Bilateral crackles at the bases, no wheezing no sensory muscle usage  CARDIOVASCULAR: S1, S2 normal. 2/6 sm ABDOMEN: Soft, nontender, nondistended. Bowel sounds present. No organomegaly or mass.  EXTREMITIES: 1+ pedal edema, cyanosis, or clubbing.   NEUROLOGIC: Cranial nerves II through XII are intact. Muscle strength 5/5 in all extremities. Sensation intact. Gait not checked.  PSYCHIATRIC: The patient is alert and oriented x 3.  SKIN: No obvious rash, lesion, or ulcer.   Lab Results  Recent Labs  11/28/14 0946 11/29/14 0536  WBC 6.4 6.2  HGB 9.9* 9.5*  HCT 31.1* 29.4*  NA 128* 129*  K 3.9 3.5  CL 86* 88*  CO2 29 29  BUN 19 21*  CREATININE 0.98 0.91   BNP    Component Value Date/Time   BNP 1557.0* 11/28/2014 0946   Microbiology:  Results for orders placed or performed during the hospital encounter of 11/28/14  Culture, blood (routine x 2)     Status: None (Preliminary result)   Collection Time: 11/28/14  9:47 AM  Result Value Ref Range Status   Specimen Description BLOOD  Final   Special Requests NONE  Final   Culture NO GROWTH < 24 HOURS  Final   Report Status PENDING  Incomplete  Culture, blood (routine x 2)     Status: None (Preliminary result)   Collection Time: 11/28/14  9:47 AM  Result Value Ref Range Status   Specimen Description BLOOD  Final   Special Requests NONE  Final   Culture NO GROWTH < 24 HOURS  Final   Report  Status PENDING  Incomplete    Studies/Results: Dg Chest Portable 1 View  11/28/2014   CLINICAL DATA:  Short of breath. Progressive shortness of breath with exertion.  EXAM: PORTABLE CHEST - 1 VIEW  COMPARISON:  11/02/2014.  10/26/2014.  FINDINGS: Cardiopericardial silhouette borderline for projection. Aortic arch atherosclerosis.  Perihilar and basilar predominant airspace disease is present, most compatible with pulmonary edema. Multifocal pneumonia is in the differential considerations. Given the prior CT scans, some of this may represent post infectious/inflammatory changes or ARDS. Small bilateral pleural effusions are present with blunting of both costophrenic angles.  When compared to chest radiograph 11/01/2014, pulmonary aeration is improved.  IMPRESSION: Bilateral basilar and  perihilar predominant airspace disease most compatible with pulmonary edema. This is probably superimposed on chronic interstitial and airspace opacities identified on prior exams. Recurrent/progressive Multifocal pneumonia is considered unlikely, particularly given the improvement from prior chest radiograph and CT.   Electronically Signed   By: Dereck Ligas M.D.   On: 11/28/2014 10:09    Assessment/Plan: Patient is a 81 year old white male with multiple medical problems presents with progressive shortness of breath 1. Acute respiratory failure due to acute systolic CHF with mod to severe AS Continue IV Lasix - increase to 80 IV bid since min response to 40 IV, consider zarolyxn Repeat echo.  - Monitor closely with diuresis given AS - ? TAVR candidate for AS - Cards help appreciated  2. Elevated troponin - up to 6.68 : Suspect due to non-ST MI,  - Cards recommends IV heparin -trend his cardiac enzymes, continue Toprol-Xl, statin, asa 3. Diabetes type 2; low-dose sliding scale patient will be continued on glipizide 4. History of COPD continue Symbicort, no evidence of exasperation 5. History of atrial fibrillation Eliquis will be held due to IV heparin:  6. Aortic stenosis: Per cardiology - ? TAVR candidate 7. Glaucoma continue eyedrops as taking at home 8. Code: Full  [name]   11/29/2014, 12:03 PM

## 2014-11-29 NOTE — Progress Notes (Signed)
Pt called to nurse station stating " I see blood and I don't know where its coming from" . Pt bumped right hand/arm against bed. Pt was bleeding with a new skin tear to lower right arm/ by the wrist. Area cleansed and gauze dressing applied.

## 2014-11-29 NOTE — Progress Notes (Signed)
Initial Nutrition Assessment  DOCUMENTATION CODES:  Non-severe (moderate) malnutrition in context of chronic illness  INTERVENTION:    Medical Food Supplement Therapy: pt agreeable to trying [employer] instead of Ensure/Boost; wills end TID on meal trays Meals and Snacks: Cater to patient preferences  NUTRITION DIAGNOSIS:  Inadequate oral intake related to  (poor appetite) as evidenced by per patient/family report.  GOAL:  Patient will meet greater than or equal to 90% of their needs   MONITOR:   (Energy Intake, Anthropometrics, Electrolyte/Renal Profile, Digestive System)  REASON FOR ASSESSMENT:   (Diagnosis CHF)    ASSESSMENT:  Pt admitted with SOB with acute CHF, SOB on exertion  PMHx:  Past Medical History  Diagnosis Date  . Diabetes mellitus without complication   . Hypertension   . Reflux   . CHF (congestive heart failure), NYHA class III 10/31/2014  . Hyperlipemia   . Glaucoma   . Generalized OA   . Colon polyp   . Tongue cancer   . DDD (degenerative disc disease), cervical   . Cytopenia   . CAD (coronary artery disease)   . Environmental allergies   . Aortic stenosis   . Neuropathy   . Gout   . Cataract   . Basal cell carcinoma   . Fatty liver   . COPD (chronic obstructive pulmonary disease)   . Barrett esophagus   . Atrial fibrillation   . Pneumonia     Diet Order: Carb controlled, Heart Healthy  Current Nutrition: Pt ate some grits with scrambled eggs mixed in and some juice this AM, pt reports fair appetite  Food/Nutrition-Related History: Pt reports poor appetite over unknown time frame, pt reports he has been drinking Boost/Ensure but thinks too sweet   Medications: lasix, glucotrol, aspart, MVI  Electrolyte/Renal Profile and Glucose Profile:   Recent Labs Lab 11/28/14 0946 11/29/14 0536  NA 128* 129*  K 3.9 3.5  CL 86* 88*  CO2 29 29  BUN 19 21*  CREATININE 0.98 0.91  CALCIUM 8.8* 8.7*  GLUCOSE 168* 149*    Protein Profile:   Recent Labs Lab 11/28/14 0946  ALBUMIN 3.3*    Gastrointestinal Profile: Last BM: 7/1   Nutrition-Focused Physical Exam Findings: Nutrition-Focused physical exam completed. Findings are mild/moderate fat depletion, severe muscle depletion in all areas except LE, and moderate edema.     Weight Change: Pt reports he weighed 219 pounds a few weeks ago; 6.8% wt loss Anthropometrics:   Height:  Ht Readings from Last 1 Encounters:  11/28/14 6\' 4"  (1.93 m)    Weight:  Wt Readings from Last 1 Encounters:  11/28/14 204 lb (92.534 kg)   Noted weight trending down over last few years per documented weights  Wt Readings from Last 10 Encounters:  11/28/14 204 lb (92.534 kg)  11/02/14 216 lb 9 oz (98.233 kg)  08/05/13 250 lb (113.399 kg)  05/06/13 250 lb (113.399 kg)    BMI:  Body mass index is 24.84 kg/(m^2).  Estimated Nutritional Needs:  Kcal:  4166-0630 kcals (BEE 1727, 1.3 AF, 1.0-1.2 IF)   Protein:  92-110 g (1.0-1.2 g/kg)   Fluid:  1840-2300 mL (20-65ml/kg)    MODERATE Care Level'  Kerman Passey [place], [place], [place] [phone] Pager

## 2014-11-29 NOTE — Progress Notes (Signed)
ANTICOAGULATION CONSULT NOTE - Initial Consult  Pharmacy Consult for Heparin Indication: chest pain/ACS  Allergies  Allergen Reactions  . Cefdinir Diarrhea  . Cephalexin Diarrhea  . Codeine Nausea Only  . Doxycycline Nausea And Vomiting  . Tape Rash    Red rash     Patient Measurements: Height: 6\' 4"  (193 cm) Weight: 204 lb (92.534 kg) IBW/kg (Calculated) : 86.8 Heparin Dosing Weight: 92.5 kg  Vital Signs: Temp: 97.6 F (36.4 C) (07/04 0407) Temp Source: Oral (07/04 0407) BP: 108/61 mmHg (07/04 0409) Pulse Rate: 86 (07/04 0409)  Labs:  Recent Labs  11/28/14 0946 11/28/14 1240 11/28/14 1820 11/29/14 0115 11/29/14 0536 11/29/14 1025  HGB 9.9*  --   --   --  9.5*  --   HCT 31.1*  --   --   --  29.4*  --   PLT 195  --   --   --  178  --   APTT 38*  --   --  89*  --  50*  LABPROT 15.5*  --   --   --   --   --   INR 1.21  --   --   --   --   --   HEPARINUNFRC  --   --  1.41*  --   --  1.00*  CREATININE 0.98  --   --   --  0.91  --   TROPONINI 2.93* 3.25* 4.52* 6.68*  --   --     Estimated Creatinine Clearance: 78.2 mL/min (by C-G formula based on Cr of 0.91).   Medications:  Assessment: Pt was on Eliquis 5 mg PO daily at home, now on heparin drip. Dosing by aPTT until values correlate with heparin level.   Goal of Therapy:  Heparin level 0.3-0.7 units/ml, Target aPTT 66-102s   Plan:  Current orders for heparin 1200 units/hr. aPTT subtherapeutic, HL remains elevated due to prior use of apixaban.   Will increase heparin to 1300 units/hr and recheck aPTT in 6 hours.   Will continue to check aPTT until values correlate with heparin level (66-102s equivalent to HL 0.3-0.7). HL/aPTT ordered with AM labs.   Rexene Edison, PharmD Clinical Pharmacist   11/29/2014,12:06 PM

## 2014-11-30 ENCOUNTER — Inpatient Hospital Stay: Payer: Medicare Other

## 2014-11-30 LAB — CBC
HCT: 28.4 % — ABNORMAL LOW (ref 40.0–52.0)
HEMOGLOBIN: 9.1 g/dL — AB (ref 13.0–18.0)
MCH: 24.8 pg — ABNORMAL LOW (ref 26.0–34.0)
MCHC: 32.1 g/dL (ref 32.0–36.0)
MCV: 77.2 fL — ABNORMAL LOW (ref 80.0–100.0)
Platelets: 167 10*3/uL (ref 150–440)
RBC: 3.68 MIL/uL — AB (ref 4.40–5.90)
RDW: 16.9 % — ABNORMAL HIGH (ref 11.5–14.5)
WBC: 5.2 10*3/uL (ref 3.8–10.6)

## 2014-11-30 LAB — GLUCOSE, CAPILLARY
GLUCOSE-CAPILLARY: 101 mg/dL — AB (ref 65–99)
Glucose-Capillary: 121 mg/dL — ABNORMAL HIGH (ref 65–99)
Glucose-Capillary: 161 mg/dL — ABNORMAL HIGH (ref 65–99)
Glucose-Capillary: 137 mg/dL — ABNORMAL HIGH (ref 65–99)

## 2014-11-30 LAB — BASIC METABOLIC PANEL
Anion gap: 12 (ref 5–15)
BUN: 23 mg/dL — AB (ref 6–20)
CHLORIDE: 87 mmol/L — AB (ref 101–111)
CO2: 30 mmol/L (ref 22–32)
Calcium: 8.6 mg/dL — ABNORMAL LOW (ref 8.9–10.3)
Creatinine, Ser: 0.93 mg/dL (ref 0.61–1.24)
GFR calc non Af Amer: >60 mL/min (ref >60)
Glucose, Bld: 118 mg/dL — ABNORMAL HIGH (ref 65–99)
Potassium: 3.2 mmol/L — ABNORMAL LOW (ref 3.5–5.1)
Sodium: 129 mmol/L — ABNORMAL LOW (ref 135–145)

## 2014-11-30 LAB — HEPARIN LEVEL (UNFRACTIONATED): Heparin Unfractionated: 0.85 IU/mL — ABNORMAL HIGH (ref 0.30–0.70)

## 2014-11-30 LAB — APTT
APTT: 73 s — AB (ref 24–36)
aPTT: 108 seconds — ABNORMAL HIGH (ref 24–36)

## 2014-11-30 MED ORDER — ASPIRIN 81 MG PO CHEW: 81.0000 mg | CHEWABLE_TABLET | ORAL | Status: AC

## 2014-11-30 MED ORDER — POTASSIUM CHLORIDE 20 MEQ PO PACK
20.0000 meq | PACK | Freq: Two times a day (BID) | ORAL | Status: DC
  Administered 2014-11-30 – 2014-12-02 (×4): 20 meq via ORAL
  Filled 2014-11-30 (×5): qty 1

## 2014-11-30 NOTE — Care Management (Signed)
Important Message  Patient Details  Name: [name] MRN: [id number] Date of Birth: 10-16-1931   Medicare Important Message Given:  Yes-second notification given    [name] 11/30/2014, 10:58 AM

## 2014-11-30 NOTE — Care Management (Signed)
Patient is readmission for similar symptoms.  He is currently active with [facility].  Appears dyspneic at rest with 02 and this increases with conversation.  Advanced notified of readmission.  Anticipate cardiac cath.  Patient's aortic stenosis is of significant concern as contributing to patient shortness of breath

## 2014-11-30 NOTE — Progress Notes (Signed)
ANTICOAGULATION CONSULT NOTE - Follow Up Consult  Pharmacy Consult for Heparin Indication: chest pain/ACS  Allergies  Allergen Reactions  . Cefdinir Diarrhea  . Cephalexin Diarrhea  . Codeine Nausea Only  . Doxycycline Nausea And Vomiting  . Tape Rash    Red rash     Patient Measurements: Height: 6\' 4"  (193 cm) Weight: 204 lb (92.534 kg) IBW/kg (Calculated) : 86.8 Heparin Dosing Weight: 92.5 kg  Vital Signs: BP: 96/66 mmHg (07/05 1019) Pulse Rate: 99 (07/05 1019)  Labs:  Recent Labs  11/28/14 0946 11/28/14 1240 11/28/14 1820 11/29/14 0115 11/29/14 0536 11/29/14 1025 11/29/14 1826 11/30/14 0446 11/30/14 1056  HGB 9.9*  --   --   --  9.5*  --   --  9.1*  --   HCT 31.1*  --   --   --  29.4*  --   --  28.4*  --   PLT 195  --   --   --  178  --   --  167  --   APTT 38*  --   --  89*  --  50* 95* 108* 73*  LABPROT 15.5*  --   --   --   --   --   --   --   --   INR 1.21  --   --   --   --   --   --   --   --   HEPARINUNFRC  --   --  1.41*  --   --  1.00*  --  0.85*  --   CREATININE 0.98  --   --   --  0.91  --   --  0.93  --   TROPONINI 2.93* 3.25* 4.52* 6.68*  --   --   --   --   --     Estimated Creatinine Clearance: 76.5 mL/min (by C-G formula based on Cr of 0.93).   Medications:  Scheduled:  . [START ON 12/01/2014] aspirin  81 mg Oral Pre-Cath  . aspirin EC  81 mg Oral Daily  . budesonide-formoterol  2 puff Inhalation BID  . cholecalciferol  2,000 Units Oral Daily  . docusate sodium  100 mg Oral BID  . furosemide  80 mg Intravenous BID  . glipiZIDE  10 mg Oral Q breakfast  . insulin aspart  0-9 Units Subcutaneous TID WC  . ipratropium  2 spray Nasal BID  . metoprolol succinate  25 mg Oral Daily  . montelukast  10 mg Oral Daily  . multivitamin with minerals  1 tablet Oral Daily  . omega-3 acid ethyl esters  1,000 mg Oral Daily  . pantoprazole  40 mg Oral Daily  . potassium chloride  20 mEq Oral BID  . simvastatin  20 mg Oral QHS  . sodium chloride  3 mL  Intravenous Q12H  . timolol  1 drop Both Eyes Daily  . vitamin E  400 Units Oral Daily   Infusions:  . heparin 1,300 Units/hr (11/30/14 0726)   PRN: sodium chloride, acetaminophen **OR** acetaminophen, ipratropium-albuterol, ondansetron **OR** ondansetron (ZOFRAN) IV, ondansetron, sodium chloride  Assessment: Patient on Eliquis PTA now on heparin drip. Dosing by aPTT until values correlate with heparin level. APTT at goal this AM.   Rechecked APTT at 1100 due to level of 108 (upper limit of goal).  APTT recheck at 1100 of 73.  Goal of Therapy:  Heparin level 0.3-0.7 units/ml aPTT 68-109 seconds Monitor platelets by anticoagulation protocol: Yes  Plan:  Will continue heparin at current rate and recheck aPTT/HL in AM.   Corky Blumstein G 11/30/2014,1:52 PM

## 2014-11-30 NOTE — Progress Notes (Signed)
PT Cancellation Note  Patient Details Name: [name] MRN: [id number] DOB: 1932/04/09   Cancelled Treatment:    Reason Eval/Treat Not Completed: Medical issues which prohibited therapy (Consult received and chart reviewed.  Per discussion with primary RN, patient scheduled for cardiac cath next date.  Troponins remain elevated (trending upward), and remains on heparin drip.  Will hold PT at this time and re-attempt next date as medically appropriate.)   Zanasia Hickson H. Owens Shark, PT, DPT, NCS 11/30/2014, 9:52 AM [phone]

## 2014-11-30 NOTE — Progress Notes (Signed)
   11/30/14 0920  Clinical Encounter Type  Visited With Patient;Family  Visit Type Initial  Referral From Chaplain  Consult/Referral To Chaplain  Spiritual Encounters  Spiritual Needs Prayer  Stress Factors  Patient Stress Factors Exhausted;Health changes;Major life changes  Family Stress Factors Health changes;Major life changes  Visited with patient & provided spiritual care & prayer. Chap. Cass Vandermeulen G. [place]

## 2014-11-30 NOTE — Progress Notes (Signed)
Spoke with dr. Clayborn Bigness regarding scheduled dose of metoprolol and bp of 90/60. Per dr. Josefa Half this am go ahead and give scheduled 80mg  lasix. Dr. Clayborn Bigness aware stated to hold scheduled dose of metoprolol this am [facility]

## 2014-11-30 NOTE — Progress Notes (Signed)
Pt complaining of feeling short of breath.  Pt's oxygen sats have been 98-100% on 3-3.5 L since start of shift.  Pt reassured and encouraged to take deep breaths through his nose.  Will continue to monitor.

## 2014-11-30 NOTE — Progress Notes (Signed)
ANTICOAGULATION CONSULT NOTE - Follow Up Consult  Pharmacy Consult for Heparin Indication: chest pain/ACS  Allergies  Allergen Reactions  . Cefdinir Diarrhea  . Cephalexin Diarrhea  . Codeine Nausea Only  . Doxycycline Nausea And Vomiting  . Tape Rash    Red rash     Patient Measurements: Height: 6\' 4"  (193 cm) Weight: 204 lb (92.534 kg) IBW/kg (Calculated) : 86.8 Heparin Dosing Weight: 92.5 kg  Vital Signs: Temp: 97.5 F (36.4 C) (07/04 2012) Temp Source: Oral (07/04 2012) BP: 98/64 mmHg (07/05 0454) Pulse Rate: 87 (07/05 0454)  Labs:  Recent Labs  11/28/14 0946 11/28/14 1240 11/28/14 1820 11/29/14 0115 11/29/14 0536 11/29/14 1025 11/29/14 1826 11/30/14 0446  HGB 9.9*  --   --   --  9.5*  --   --  9.1*  HCT 31.1*  --   --   --  29.4*  --   --  28.4*  PLT 195  --   --   --  178  --   --  167  APTT 38*  --   --  89*  --  50* 95* 108*  LABPROT 15.5*  --   --   --   --   --   --   --   INR 1.21  --   --   --   --   --   --   --   HEPARINUNFRC  --   --  1.41*  --   --  1.00*  --  0.85*  CREATININE 0.98  --   --   --  0.91  --   --  0.93  TROPONINI 2.93* 3.25* 4.52* 6.68*  --   --   --   --     Estimated Creatinine Clearance: 76.5 mL/min (by C-G formula based on Cr of 0.93).   Medications:  Scheduled:  . aspirin EC  81 mg Oral Daily  . budesonide-formoterol  2 puff Inhalation BID  . cholecalciferol  2,000 Units Oral Daily  . docusate sodium  100 mg Oral BID  . furosemide  80 mg Intravenous BID  . glipiZIDE  10 mg Oral Q breakfast  . insulin aspart  0-9 Units Subcutaneous TID WC  . ipratropium  2 spray Nasal BID  . metoprolol succinate  25 mg Oral Daily  . montelukast  10 mg Oral Daily  . multivitamin with minerals  1 tablet Oral Daily  . omega-3 acid ethyl esters  1,000 mg Oral Daily  . pantoprazole  40 mg Oral Daily  . potassium chloride  20 mEq Oral BID  . simvastatin  20 mg Oral QHS  . sodium chloride  3 mL Intravenous Q12H  . timolol  1 drop Both  Eyes Daily  . vitamin E  400 Units Oral Daily   Infusions:  . heparin 1,300 Units/hr (11/30/14 0550)   PRN: sodium chloride, acetaminophen **OR** acetaminophen, ipratropium-albuterol, ondansetron **OR** ondansetron (ZOFRAN) IV, ondansetron, sodium chloride  Assessment: Patient on Eliquis PTA now on heparin drip. Dosing by aPTT until values correlate with heparin level. APTT at goal this AM.   Goal of Therapy:  Heparin level 0.3-0.7 units/ml aPTT 68-109 seconds Monitor platelets by anticoagulation protocol: Yes   Plan:  Will continue heparin at current rate and recheck aPTT/HL in AM.   Ulice Dash D 11/30/2014,7:05 AM

## 2014-11-30 NOTE — Progress Notes (Signed)
Carrel Leather is a 81 y.o. male  <principal problem not specified>   SUBJECTIVE:  Pt admitted with worsening SOB and CHF. Echo much worse. Troponins elevated c/w acute MI. Pt c/o diffuse weakness and severe SOB/DOE. Some LE edema. On IV Lasix and O2.  ______________________________________________________________________  ROS: Review of systems is unremarkable for any active cardiac,respiratory, GI, GU, hematologic, neurologic or psychiatric systems, 10 systems reviewed.  @CMEDLIST @  Past Medical History  Diagnosis Date  . Diabetes mellitus without complication   . Hypertension   . Reflux   . CHF (congestive heart failure), NYHA class III 10/31/2014  . Hyperlipemia   . Glaucoma   . Generalized OA   . Colon polyp   . Tongue cancer   . DDD (degenerative disc disease), cervical   . Cytopenia   . CAD (coronary artery disease)   . Environmental allergies   . Aortic stenosis   . Neuropathy   . Gout   . Cataract   . Basal cell carcinoma   . Fatty liver   . COPD (chronic obstructive pulmonary disease)   . Barrett esophagus   . Atrial fibrillation   . Pneumonia     Past Surgical History  Procedure Laterality Date  . Hip surgery Left   . Tongue cancer resection    . Cataract extraction    . Hip arthroplasty Bilateral   . Esopageal radio frequency ablation      PHYSICAL EXAM:  BP 98/64 mmHg  Pulse 87  Temp(Src) 97.5 F (36.4 C) (Oral)  Resp 23  Ht 6\' 4"  (1.93 m)  Wt 92.534 kg (204 lb)  BMI 24.84 kg/m2  SpO2 100%  Wt Readings from Last 3 Encounters:  11/28/14 92.534 kg (204 lb)  11/02/14 98.233 kg (216 lb 9 oz)  08/05/13 113.399 kg (250 lb)            Constitutional: NAD Neck: supple, no thyromegaly Respiratory: basilar rales, no rhonchi or wheezes Cardiovascular: irregularly irregular with systolic murmur noted, no gallop Abdomen: soft, good BS, nontender Extremities: 1+ edema Neuro: alert and oriented, no focal motor or sensory  deficits  ASSESSMENT/PLAN:  Labs and imaging studies were reviewed  Will continue IV Lasix and supplement K+. Wean O2. CXR today. PT and CSW consults today. PC consult today as prognosis is poor. Await f/u from Cardiology regarding further intervention/plans regarding cardiac decompensation. F/u labs in AM.

## 2014-11-30 NOTE — Progress Notes (Addendum)
   11/30/14 1900  Clinical Encounter Type  Visited With Patient  Visit Type Initial  Spiritual Encounters  Spiritual Needs Prayer  Stress Factors  Patient Stress Factors Health changes   Faith tradition: Catholic Status: alert and oriented/cather surgery pending tomorrow, good spirits Family: none present but wife will attend tomorrow, he said Visit Assessment: Chaplain gave encouraging presurgical words and the patient shared that he lived in [place] for years, worked in the [employer]. 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 The chaplain did offer the patient a priest referral if need be, however he said that his Father from the [employer] has visited him and he has been prayed for.  Chaplains and pastoral care can be reached via pager [phone] and by online request.

## 2014-11-30 NOTE — Progress Notes (Signed)
Patient requesting to speak to dr. sparks about a medication dr. Ola Spurr recommended. When patient asked what the name of the medication is patient stated "i dont know". Patient insisting to speak to dr. Doy Hutching about the medication. Informed patient that as his nurse I can speak with dr. Doy Hutching and make aware of patients request and that he and i can possible look at mds note to see if he recommended any new medications. However patient continues to  insist staying why do you have to talk to him why cant i, he is my dr why do you have to be in the middle of it. Made patient aware its my job to to talk to the drs, however patient continue to insist. Made patient aware i will page dr. Doy Hutching and have him talk to the patient. Patient insisting for me to page the dr right away, made patient aware i need to look at other patient and then i will page dr sparks for him. However patient still insisting i page him right away for him. Also made patient aware the since he had heparin infusing, he needs another iv to receive medication such as lasix. Attempted to restart another iv however unsuccessful. Patient continues to state i dont understand why i need another iv just stop it and give it to me in my iv. Unable to talk to patient and make him understand our [place]

## 2014-11-30 NOTE — Progress Notes (Signed)
Got a called from [place] in pharmacy to change Heparin gtt to 37ml/hr.

## 2014-12-01 ENCOUNTER — Encounter: Payer: Self-pay | Admitting: Cardiology

## 2014-12-01 ENCOUNTER — Encounter
Admission: EM | Disposition: A | Discharge status: Other Acute Inpatient Hospital | Payer: Self-pay | Source: Home / Self Care | Attending: Internal Medicine

## 2014-12-01 HISTORY — PX: CARDIAC CATHETERIZATION: SHX172

## 2014-12-01 LAB — BASIC METABOLIC PANEL
Anion gap: 14 (ref 5–15)
BUN: 22 mg/dL — ABNORMAL HIGH (ref 6–20)
CHLORIDE: 86 mmol/L — AB (ref 101–111)
CO2: 26 mmol/L (ref 22–32)
CREATININE: 0.88 mg/dL (ref 0.61–1.24)
Calcium: 8.8 mg/dL — ABNORMAL LOW (ref 8.9–10.3)
GFR calc Af Amer: >60 mL/min (ref >60)
GFR calc non Af Amer: >60 mL/min (ref >60)
GLUCOSE: 136 mg/dL — AB (ref 65–99)
POTASSIUM: 3.6 mmol/L (ref 3.5–5.1)
Sodium: 126 mmol/L — ABNORMAL LOW (ref 135–145)

## 2014-12-01 LAB — CBC WITH DIFFERENTIAL/PLATELET
BASOS ABS: 0.1 10*3/uL (ref 0–0.1)
BASOS PCT: 1 %
Eosinophils Absolute: 0 10*3/uL (ref 0–0.7)
Eosinophils Relative: 1 %
HEMATOCRIT: 27.6 % — AB (ref 40.0–52.0)
Hemoglobin: 9 g/dL — ABNORMAL LOW (ref 13.0–18.0)
LYMPHS PCT: 15 %
Lymphs Abs: 0.7 10*3/uL — ABNORMAL LOW (ref 1.0–3.6)
MCH: 24.8 pg — ABNORMAL LOW (ref 26.0–34.0)
MCHC: 32.6 g/dL (ref 32.0–36.0)
MCV: 76.3 fL — ABNORMAL LOW (ref 80.0–100.0)
Monocytes Absolute: 0.4 10*3/uL (ref 0.2–1.0)
Monocytes Relative: 8 %
NEUTROS PCT: 75 %
Neutro Abs: 3.9 10*3/uL (ref 1.4–6.5)
Platelets: 157 10*3/uL (ref 150–440)
RBC: 3.62 MIL/uL — ABNORMAL LOW (ref 4.40–5.90)
RDW: 17 % — AB (ref 11.5–14.5)
WBC: 5.1 10*3/uL (ref 3.8–10.6)

## 2014-12-01 LAB — GLUCOSE, CAPILLARY
Glucose-Capillary: 134 mg/dL — ABNORMAL HIGH (ref 65–99)
Glucose-Capillary: 125 mg/dL — ABNORMAL HIGH (ref 65–99)
Glucose-Capillary: 136 mg/dL — ABNORMAL HIGH (ref 65–99)

## 2014-12-01 LAB — APTT: aPTT: 108 seconds — ABNORMAL HIGH (ref 24–36)

## 2014-12-01 LAB — HEPARIN LEVEL (UNFRACTIONATED): HEPARIN UNFRACTIONATED: 0.66 [IU]/mL (ref 0.30–0.70)

## 2014-12-01 SURGERY — LEFT HEART CATH
Anesthesia: Moderate Sedation

## 2014-12-01 MED ORDER — FUROSEMIDE 10 MG/ML IJ SOLN
40.0000 mg | Freq: Once | INTRAMUSCULAR | Status: AC
  Administered 2014-12-01: 40 mg via INTRAVENOUS

## 2014-12-01 MED ORDER — FENTANYL CITRATE (PF) 100 MCG/2ML IJ SOLN
INTRAMUSCULAR | Status: DC | PRN
  Administered 2014-12-01 (×2): 25 ug via INTRAVENOUS

## 2014-12-01 MED ORDER — ACETAMINOPHEN 325 MG PO TABS: 650.0000 mg | ORAL_TABLET | ORAL | Status: DC | PRN

## 2014-12-01 MED ORDER — SODIUM CHLORIDE 0.9 % IJ SOLN: 3.0000 mL | Freq: Two times a day (BID) | INTRAMUSCULAR | Status: DC

## 2014-12-01 MED ORDER — SODIUM CHLORIDE 0.9 % WEIGHT BASED INFUSION: 1.0000 mL/kg/h | INTRAVENOUS | Status: AC

## 2014-12-01 MED ORDER — SODIUM CHLORIDE 0.9 % WEIGHT BASED INFUSION: 1.0000 mL/kg/h | INTRAVENOUS | Status: DC

## 2014-12-01 MED ORDER — FUROSEMIDE 10 MG/ML IJ SOLN
INTRAMUSCULAR | Status: AC
Start: 2014-12-01 — End: 2014-12-01
  Filled 2014-12-01: qty 4

## 2014-12-01 MED ORDER — LORAZEPAM 1 MG PO TABS
1.0000 mg | ORAL_TABLET | Freq: Every evening | ORAL | Status: DC | PRN
  Administered 2014-12-01: 1 mg via ORAL
  Filled 2014-12-01: qty 1

## 2014-12-01 MED ORDER — HEPARIN (PORCINE) IN NACL 2-0.9 UNIT/ML-% IJ SOLN
INTRAMUSCULAR | Status: AC
  Filled 2014-12-01: qty 1000

## 2014-12-01 MED ORDER — ONDANSETRON HCL 4 MG/2ML IJ SOLN
4.0000 mg | Freq: Four times a day (QID) | INTRAMUSCULAR | Status: DC | PRN
  Administered 2014-12-02: 4 mg via INTRAVENOUS

## 2014-12-01 MED ORDER — SODIUM CHLORIDE 0.9 % IJ SOLN: 3.0000 mL | INTRAMUSCULAR | Status: DC | PRN

## 2014-12-01 MED ORDER — IOHEXOL 300 MG/ML  SOLN
INTRAMUSCULAR | Status: DC | PRN
  Administered 2014-12-01: 90 mL via INTRA_ARTERIAL
  Administered 2014-12-01: 30 mL via INTRA_ARTERIAL

## 2014-12-01 MED ORDER — SODIUM CHLORIDE 0.9 % IV SOLN: 250.0000 mL | INTRAVENOUS | Status: DC | PRN

## 2014-12-01 MED ORDER — MORPHINE SULFATE 2 MG/ML IJ SOLN
INTRAMUSCULAR | Status: AC
  Filled 2014-12-01: qty 1

## 2014-12-01 MED ORDER — FENTANYL CITRATE (PF) 100 MCG/2ML IJ SOLN
INTRAMUSCULAR | Status: AC
  Filled 2014-12-01: qty 2

## 2014-12-01 MED ORDER — SODIUM CHLORIDE 0.9 % WEIGHT BASED INFUSION
3.0000 mL/kg/h | INTRAVENOUS | Status: DC
  Administered 2014-12-01: 3 mL/kg/h via INTRAVENOUS

## 2014-12-01 MED ORDER — MORPHINE SULFATE 2 MG/ML IJ SOLN
2.0000 mg | Freq: Once | INTRAMUSCULAR | Status: AC
  Administered 2014-12-01: 2 mg via INTRAVENOUS

## 2014-12-01 MED ORDER — FUROSEMIDE 10 MG/ML IJ SOLN
40.0000 mg | Freq: Once | INTRAMUSCULAR | Status: AC
  Administered 2014-12-01: 40 mg via INTRAVENOUS
  Filled 2014-12-01: qty 4

## 2014-12-01 MED ORDER — SODIUM CHLORIDE 0.9 % IJ SOLN
3.0000 mL | Freq: Two times a day (BID) | INTRAMUSCULAR | Status: DC
  Administered 2014-12-01: 3 mL via INTRAVENOUS

## 2014-12-01 SURGICAL SUPPLY — 13 items
CATH INFINITI 5FR ANG PIGTAIL (CATHETERS) ×3 IMPLANT
CATH INFINITI 5FR JL4 (CATHETERS) ×3 IMPLANT
CATH INFINITI JR4 5F (CATHETERS) ×3 IMPLANT
CATH LANGSTON DUAL LUM PIG 6FR (CATHETERS) ×3 IMPLANT
CATH SWANZ 7F THERMO (CATHETERS) ×3 IMPLANT
DEVICE CLOSURE MYNXGRIP 6/7F (Vascular Products) ×3 IMPLANT
KIT MANI 3VAL PERCEP (MISCELLANEOUS) ×3 IMPLANT
NEEDLE PERC 18GX7CM (NEEDLE) ×3 IMPLANT
PACK CARDIAC CATH (CUSTOM PROCEDURE TRAY) ×3 IMPLANT
SHEATH AVANTI 5FR X 11CM (SHEATH) ×3 IMPLANT
SHEATH AVANTI 6FR X 11CM (SHEATH) ×3 IMPLANT
SHEATH PINNACLE 7F 10CM (SHEATH) ×3 IMPLANT
WIRE EMERALD 3MM-J .035X150CM (WIRE) ×3 IMPLANT

## 2014-12-01 NOTE — Progress Notes (Signed)
Patient alert and talkative with Dr. Saralyn Pilar and family.  Report called to floor nurse.Check right groin for bleeding or hematoma.  Patient will be on bedrest for 2 hours post sheath pull---out of bed at 0930.  Bilateral pulses are 1's DP's. ___________.

## 2014-12-01 NOTE — Progress Notes (Signed)
Patient became very short of breath quickly, sat straight up in bed with air hunger.. Dr. Saralyn Pilar here and ordered morphine,lasix and foley catheter inserted.  meds given.  #16 french foley cath inserted using sterile technique  Amber urine returned 1100 mls

## 2014-12-01 NOTE — Progress Notes (Signed)
   12/01/14 1645  Clinical Encounter Type  Visited With Patient  Visit Type Follow-up  Referral From Chaplain  Consult/Referral To Chaplain  Spiritual Encounters  Spiritual Needs Emotional  Stress Factors  Patient Stress Factors Exhausted;Health changes;Major life changes  Family Stress Factors Major life changes  Visited with patient and provided rosary. Patient alert and optimistic for medical review scheduled for 12/03/14.  Chap. Elbie Statzer G. [place]

## 2014-12-01 NOTE — Progress Notes (Signed)
[name] is a 82 y.o. male  <principal problem not specified>   SUBJECTIVE:  Pt remains extremely SOB. No CP. Sats 100% on 3L. No fever. Labs stable. CXR unchanged.  ______________________________________________________________________  ROS: Review of systems is unremarkable for any active cardiac,respiratory, GI, GU, hematologic, neurologic or psychiatric systems, 10 systems reviewed.  @CMEDLIST @  Past Medical History  Diagnosis Date  . Diabetes mellitus without complication   . Hypertension   . Reflux   . CHF (congestive heart failure), NYHA class III 10/31/2014  . Hyperlipemia   . Glaucoma   . Generalized OA   . Colon polyp   . Tongue cancer   . DDD (degenerative disc disease), cervical   . Cytopenia   . CAD (coronary artery disease)   . Environmental allergies   . Aortic stenosis   . Neuropathy   . Gout   . Cataract   . Basal cell carcinoma   . Fatty liver   . COPD (chronic obstructive pulmonary disease)   . Barrett esophagus   . Atrial fibrillation   . Pneumonia     Past Surgical History  Procedure Laterality Date  . Hip surgery Left   . Tongue cancer resection    . Cataract extraction    . Hip arthroplasty Bilateral   . Esopageal radio frequency ablation      PHYSICAL EXAM:  BP 94/67 mmHg  Pulse 74  Temp(Src) 97.3 F (36.3 C) (Oral)  Resp 19  Ht 6\' 4"  (1.93 m)  Wt 92.534 kg (204 lb)  BMI 24.84 kg/m2  SpO2 100%  Wt Readings from Last 3 Encounters:  11/28/14 92.534 kg (204 lb)  11/02/14 98.233 kg (216 lb 9 oz)  08/05/13 113.399 kg (250 lb)            Constitutional: NAD Neck: supple, no thyromegaly Respiratory: basilar rales, no rhonchi or wheezes Cardiovascular: irregularly irregular, no murmur, no gallop Abdomen: soft, good BS, nontender Extremities: 1+ edema Neuro: alert and oriented, no focal motor or sensory deficits  ASSESSMENT/PLAN:  Labs and imaging studies were reviewed  Will continue IV Lasix. Cardiac cath today.  Repeat labs in AM. Plans per Cardiology. Wean O2 to keep sats >92%. Pt will require SNF placement.

## 2014-12-01 NOTE — Care Management (Signed)
Patient had cardiac cath today and found to have significant CAD.  Also noted to have severe aortic stenosis and cardiomyopathy.  It was verbally reported that the results will be discussed at a conference of cardiologist at [facility] to discuss treatment options.

## 2014-12-01 NOTE — Progress Notes (Signed)
Pt's chair alarm going off.  Pt had slid down his chair with his legs hanging off the end.  Nursing staff had to wake patient to reposition him. Pt repositioned and chair alarm readjusted under his bottom. Pt slightly disoriented, pt forgot he was in the hospital.  Pt redirected and went back to sleep.  Pt was previously given ativan per prn order and per request which is new for him to help him sleep.   Will continue to monitor and hourly round.

## 2014-12-01 NOTE — Progress Notes (Signed)
ANTICOAGULATION CONSULT NOTE - Follow Up Consult  Pharmacy Consult for Heparin Indication: chest pain/ACS  Allergies  Allergen Reactions  . Cefdinir Diarrhea  . Cephalexin Diarrhea  . Codeine Nausea Only  . Doxycycline Nausea And Vomiting  . Tape Rash    Red rash     Patient Measurements: Height: 6\' 4"  (193 cm) Weight: 204 lb (92.534 kg) IBW/kg (Calculated) : 86.8 Heparin Dosing Weight: 92.5 kg  Vital Signs: Temp: 97.3 F (36.3 C) (07/06 0350) Temp Source: Oral (07/06 0350) BP: 94/67 mmHg (07/06 0351) Pulse Rate: 74 (07/06 0351)  Labs:  Recent Labs  11/28/14 0946 11/28/14 1240  11/28/14 1820 11/29/14 0115 11/29/14 0536 11/29/14 1025  11/30/14 0446 11/30/14 1056 12/01/14 0352  HGB 9.9*  --   --   --   --  9.5*  --   --  9.1*  --   --   HCT 31.1*  --   --   --   --  29.4*  --   --  28.4*  --   --   PLT 195  --   --   --   --  178  --   --  167  --   --   APTT 38*  --   --   --  89*  --  50*  < > 108* 73* 108*  LABPROT 15.5*  --   --   --   --   --   --   --   --   --   --   INR 1.21  --   --   --   --   --   --   --   --   --   --   HEPARINUNFRC  --   --   < > 1.41*  --   --  1.00*  --  0.85*  --  0.66  CREATININE 0.98  --   --   --   --  0.91  --   --  0.93  --   --   TROPONINI 2.93* 3.25*  --  4.52* 6.68*  --   --   --   --   --   --   < > = values in this interval not displayed.  Estimated Creatinine Clearance: 76.5 mL/min (by C-G formula based on Cr of 0.93).   Medications:  Scheduled:  . aspirin  81 mg Oral Pre-Cath  . aspirin EC  81 mg Oral Daily  . budesonide-formoterol  2 puff Inhalation BID  . cholecalciferol  2,000 Units Oral Daily  . docusate sodium  100 mg Oral BID  . furosemide  80 mg Intravenous BID  . glipiZIDE  10 mg Oral Q breakfast  . insulin aspart  0-9 Units Subcutaneous TID WC  . ipratropium  2 spray Nasal BID  . metoprolol succinate  25 mg Oral Daily  . montelukast  10 mg Oral Daily  . multivitamin with minerals  1 tablet Oral  Daily  . omega-3 acid ethyl esters  1,000 mg Oral Daily  . pantoprazole  40 mg Oral Daily  . potassium chloride  20 mEq Oral BID  . simvastatin  20 mg Oral QHS  . sodium chloride  3 mL Intravenous Q12H  . sodium chloride  3 mL Intravenous Q12H  . timolol  1 drop Both Eyes Daily  . vitamin E  400 Units Oral Daily   Infusions:  . [START ON 12/02/2014] sodium chloride  Followed by  . [START ON 12/02/2014] sodium chloride    . heparin 1,300 Units/hr (12/01/14 0136)   PRN: sodium chloride, sodium chloride, acetaminophen **OR** acetaminophen, ipratropium-albuterol, ondansetron **OR** ondansetron (ZOFRAN) IV, ondansetron, sodium chloride, sodium chloride  Assessment: Patient on Eliquis PTA for afib converted to heparin drip due to ACS. HL is at goal.   Goal of Therapy:  Heparin level 0.3-0.7 units/ml Monitor platelets by anticoagulation protocol: Yes   Plan:  Continue heparin drip at current rate and f/u AM labs.   Ulice Dash D 12/01/2014,4:22 AM

## 2014-12-01 NOTE — Progress Notes (Signed)
PT Cancellation Note  Patient Details Name: [name] MRN: [id number] DOB: [birth year]   Cancelled Treatment:    Reason Eval/Treat Not Completed: Medical issues which prohibited therapy (Patient scheduled for cardiac cath this date; will hold therapy and initiate post-procedure as medically appropriate.)   Lakota Schweppe H. Owens Shark, PT, DPT, NCS 12/01/2014, 8:19 AM [phone]

## 2014-12-01 NOTE — Progress Notes (Addendum)
Subjective:   shortness of breath persistent dyspnea leg edema denies chest pain complains of mild weakness  Objective:  Vital Signs in the last 24 hours: Temp:  [97.3 F (36.3 C)-98.2 F (36.8 C)] 97.3 F (36.3 C) (07/06 0350) Pulse Rate:  [74-92] 92 (07/06 1200) Resp:  [15-24] 22 (07/06 1200) BP: (90-112)/(56-72) 93/60 mmHg (07/06 1200) SpO2:  [95 %-100 %] 100 % (07/06 1200)  Intake/Output from previous day: 07/05 0701 - 07/06 0700 In: 720 [P.O.:720] Out: 1800 [Urine:1800] Intake/Output from this shift: Total I/O In: -  Out: 1025 [Urine:1025]  Physical Exam: General appearance: mild distress Neck: no adenopathy, no carotid bruit, no JVD, supple, symmetrical, trachea midline and thyroid not enlarged, symmetric, no tenderness/mass/nodules Lungs: diminished breath sounds bibasilar and bilaterally, dullness to percussion bibasilar and bilaterally and rales bibasilar and bilaterally Heart: irregularly irregular rhythm, S3 present and S4 present Abdomen: soft, non-tender; bowel sounds normal; no masses,  no organomegaly Extremities: edema  leg edema swelling Pulses: 2+ and symmetric Skin: Skin color, texture, turgor normal. No rashes or lesions Neurologic: Alert and oriented X 3, normal strength and tone. Normal symmetric reflexes. Normal coordination and gait  Lab Results:  Recent Labs  11/30/14 0446 12/01/14 0352  WBC 5.2 5.1  HGB 9.1* 9.0*  PLT 167 157    Recent Labs  11/30/14 0446 12/01/14 0352  NA 129* 126*  K 3.2* 3.6  CL 87* 86*  CO2 30 26  GLUCOSE 118* 136*  BUN 23* 22*  CREATININE 0.93 0.88    Recent Labs  11/28/14 1820 11/29/14 0115  TROPONINI 4.52* 6.68*   Hepatic Function Panel No results for input(s): PROT, ALBUMIN, AST, ALT, ALKPHOS, BILITOT, BILIDIR, IBILI in the last 72 hours. No results for input(s): CHOL in the last 72 hours. No results for input(s): PROTIME in the last 72 hours.  Imaging: Imaging results have been  reviewed  Cardiac Studies:  Assessment/Plan:  Arrhythmia Atrial Fibrillation CABG Cardiomyopathy CHF Edema Ischemic Heart Disease Shortness of Breath Valvular Disease   acute on chronic congestive heart failure with systolic dysfunction  hyponatremia  demand ischemia   elevated troponin  diabetes  moderate aortic stenosis  coronary disease . PLAN  continue telemetry  supplemental oxygen for shortness of breath  IV Lasix therapy for heart failure  continue diabetes management  echocardiogram was severely depressed left ventricular function EF=25%  continue current medical therapy for aortic stenosis  continue rate control for atrial fibrillation  continue anticoagulation therapy  continue simvastatin therapy for hyperlipidemia  consider cardiac catheterization will discuss with Dr. Saralyn Pilar  evaluate for worsening coronary disease as well as aortic stenosis        LOS: 3 days    Shanessa Hodak D. 12/01/2014, 1:04 PM

## 2014-12-01 NOTE — Progress Notes (Signed)
PT Cancellation Note  Patient Details Name: [name] MRN: [id number] DOB: July 31, 1931   Cancelled Treatment:    Reason Eval/Treat Not Completed: Medical issues which prohibited therapy (Patient returned from cardiac cath.  Per primary RN, medical staff planning conference to establish most appropriate plan of care at this time.  Will continue to hold mobility until plan established and patient fully cleared for activity in light of medical issues)   Dawnielle Christiana H. Owens Shark, PT, DPT, NCS 12/01/2014, 2:35 PM [phone]

## 2014-12-01 NOTE — Progress Notes (Signed)
Pt still complaining of shortness of breath.  Pt insisting that his oxygen be turned up.  Pt agreeable to try a breathing treatment.  RN told pt she was not going to increase oxygen from 3.5 because his sats are 100 %.  Respiratory in to give treatment and assess patient.  Will continue to monitor.

## 2014-12-02 ENCOUNTER — Ambulatory Visit (HOSPITAL_COMMUNITY)
Admission: AD | Admit: 2014-12-02 | Discharge: 2014-12-02 | Disposition: A | Discharge status: Home / Self Care | Payer: Medicare Other | Source: Other Acute Inpatient Hospital | Attending: Internal Medicine | Admitting: Internal Medicine

## 2014-12-02 ENCOUNTER — Inpatient Hospital Stay: Payer: Medicare Other

## 2014-12-02 DIAGNOSIS — J811 Chronic pulmonary edema: Secondary | ICD-10-CM | POA: Insufficient documentation

## 2014-12-02 DIAGNOSIS — I2510 Atherosclerotic heart disease of native coronary artery without angina pectoris: Secondary | ICD-10-CM | POA: Insufficient documentation

## 2014-12-02 LAB — BASIC METABOLIC PANEL
Anion gap: 12 (ref 5–15)
BUN: 20 mg/dL (ref 6–20)
CALCIUM: 8.8 mg/dL — AB (ref 8.9–10.3)
CO2: 27 mmol/L (ref 22–32)
CREATININE: 0.93 mg/dL (ref 0.61–1.24)
Chloride: 90 mmol/L — ABNORMAL LOW (ref 101–111)
GFR calc non Af Amer: >60 mL/min (ref >60)
GLUCOSE: 159 mg/dL — AB (ref 65–99)
POTASSIUM: 4 mmol/L (ref 3.5–5.1)
Sodium: 129 mmol/L — ABNORMAL LOW (ref 135–145)

## 2014-12-02 LAB — CBC WITH DIFFERENTIAL/PLATELET
BASOS ABS: 0.1 10*3/uL (ref 0–0.1)
BASOS PCT: 1 %
Eosinophils Absolute: 0.1 10*3/uL (ref 0–0.7)
Eosinophils Relative: 1 %
HEMATOCRIT: 29.7 % — AB (ref 40.0–52.0)
HEMOGLOBIN: 9.6 g/dL — AB (ref 13.0–18.0)
Lymphocytes Relative: 18 %
Lymphs Abs: 0.9 10*3/uL — ABNORMAL LOW (ref 1.0–3.6)
MCH: 25.1 pg — ABNORMAL LOW (ref 26.0–34.0)
MCHC: 32.4 g/dL (ref 32.0–36.0)
MCV: 77.3 fL — ABNORMAL LOW (ref 80.0–100.0)
Monocytes Absolute: 0.5 10*3/uL (ref 0.2–1.0)
Monocytes Relative: 9 %
NEUTROS PCT: 71 %
Neutro Abs: 3.7 10*3/uL (ref 1.4–6.5)
Platelets: 185 10*3/uL (ref 150–440)
RBC: 3.84 MIL/uL — ABNORMAL LOW (ref 4.40–5.90)
RDW: 17.5 % — AB (ref 11.5–14.5)
WBC: 5.2 10*3/uL (ref 3.8–10.6)

## 2014-12-02 LAB — GLUCOSE, CAPILLARY
Glucose-Capillary: 133 mg/dL — ABNORMAL HIGH (ref 65–99)
Glucose-Capillary: 157 mg/dL — ABNORMAL HIGH (ref 65–99)

## 2014-12-02 MED ORDER — HALOPERIDOL LACTATE 5 MG/ML IJ SOLN: 2.0000 mg | Freq: Once | INTRAMUSCULAR | Status: DC

## 2014-12-02 MED ORDER — ASPIRIN 81 MG PO CHEW: 81.0000 mg | CHEWABLE_TABLET | ORAL | Status: AC

## 2014-12-02 MED ORDER — FUROSEMIDE 10 MG/ML IJ SOLN: 80.0000 mg | Freq: Two times a day (BID) | INTRAMUSCULAR | Status: AC

## 2014-12-02 MED ORDER — IPRATROPIUM-ALBUTEROL 0.5-2.5 (3) MG/3ML IN SOLN: 3.0000 mL | RESPIRATORY_TRACT | Status: AC | PRN

## 2014-12-02 MED ORDER — SODIUM CHLORIDE 0.9 % IV SOLN: 250.0000 mL | INTRAVENOUS | Status: AC | PRN

## 2014-12-02 NOTE — Clinical Social Work Note (Signed)
Pt will transfer to [facility].  CSW signing off

## 2014-12-02 NOTE — Care Management (Signed)
Accepting physician is Conservation officer, nature.  Room  3112.  UPdated [facility]

## 2014-12-02 NOTE — Care Management (Signed)
Notified [facility] of transfer pending room assignment and accepting MD.  Packet made for [facility] with non emergent transfer form.  Attending- informed this would be Dr Doy Hutching and not cardiology- is to prepare discharge summary and Emtala form.  Demonstrated to primary nurse how to print off this form after completion.  [facility] to be notified when receive room assignment, nursing unit phone number  and first and last name of accepting physician -

## 2014-12-02 NOTE — Clinical Social Work Note (Signed)
CSW acknowledges consult, however is working with treatment team to determine if DC plan.  Pt is not medically stable to DC.

## 2014-12-02 NOTE — Progress Notes (Signed)
[name] is a 81 y.o. male  <principal problem not specified>   SUBJECTIVE:   Pt in NAD. Had an episode of unresponsiveness last night after taking Ativan. No complaints this AM except SOB/DOE. Denies CP. Cardiac cath results pending.  ______________________________________________________________________  ROS: Review of systems is unremarkable for any active cardiac,respiratory, GI, GU, hematologic, neurologic or psychiatric systems, 10 systems reviewed.  @CMEDLIST @  Past Medical History  Diagnosis Date  . Diabetes mellitus without complication   . Hypertension   . Reflux   . CHF (congestive heart failure), NYHA class III 10/31/2014  . Hyperlipemia   . Glaucoma   . Generalized OA   . Colon polyp   . Tongue cancer   . DDD (degenerative disc disease), cervical   . Cytopenia   . CAD (coronary artery disease)   . Environmental allergies   . Aortic stenosis   . Neuropathy   . Gout   . Cataract   . Basal cell carcinoma   . Fatty liver   . COPD (chronic obstructive pulmonary disease)   . Barrett esophagus   . Atrial fibrillation   . Pneumonia     Past Surgical History  Procedure Laterality Date  . Hip surgery Left   . Tongue cancer resection    . Cataract extraction    . Hip arthroplasty Bilateral   . Esopageal radio frequency ablation    . Cardiac catheterization N/A 12/01/2014    Procedure: Left Heart Cath;  Surgeon: Isaias Cowman, MD;  Location: [facility];  Service: Cardiovascular;  Laterality: N/A;    PHYSICAL EXAM:  BP 102/73 mmHg  Pulse 94  Temp(Src) 97.5 F (36.4 C) (Oral)  Resp 21  Ht 6\' 4"  (1.93 m)  Wt 92.534 kg (204 lb)  BMI 24.84 kg/m2  SpO2 100%  Wt Readings from Last 3 Encounters:  11/28/14 92.534 kg (204 lb)  11/02/14 98.233 kg (216 lb 9 oz)  08/05/13 113.399 kg (250 lb)            Constitutional: NAD Neck: supple, no thyromegaly Respiratory: basilar rales, no wheezes or rhonchi Cardiovascular: irregularly  irregular, 2/6 murmur noted, no gallop Abdomen: soft, good BS, nontender Extremities: 1+ edema Neuro: alert and oriented, no focal motor or sensory deficits  ASSESSMENT/PLAN:  Labs and imaging studies were reviewed  No change in care. CXR today. F/u labs in AM. Awaiting f/u from Cardiology regarding cath results and future plans. PC consult still pending.

## 2014-12-02 NOTE — Progress Notes (Signed)
Pt BP 92/64, spoke to dr. Saralyn Pilar to see if i should given metoprolol or lasix, instructed by MD to hold both

## 2014-12-02 NOTE — Progress Notes (Signed)
Patient ready for transfer, report given to Wes, RN at [facility], pt and family verbalize understanding, family updated on room and phone number, [facility] arrived for transfer, packet given to [facility]

## 2014-12-02 NOTE — Consult Note (Signed)
Palliative Medicine Inpatient Consult Follow Up Note   Name: [name] Date: 12/02/2014 MRN: [id number]  DOB: June 22, 1931  Referring Physician: No att. providers found  Palliative Care consult requested for this 81 y.o. male for goals of medical therapy in patient with CAD and Aortic Stenosis causing profound shortness of breath and pulmonary and leg edema.  He had confusionand shortness of breath followed by hiim being unresponsive during the night.   A code was called.  Chest compressions were started but he came to and was moved to the bed.  He was agitated and nauseated.  Then, he became alert and oriented again and settled down.      CODE STATUS: Full code   PAST MEDICAL HISTORY: Past Medical History  Diagnosis Date  . Diabetes mellitus without complication   . Hypertension   . Reflux   . CHF (congestive heart failure), NYHA class III 10/31/2014  . Hyperlipemia   . Glaucoma   . Generalized OA   . Colon polyp   . Tongue cancer   . DDD (degenerative disc disease), cervical   . Cytopenia   . CAD (coronary artery disease)   . Environmental allergies   . Aortic stenosis   . Neuropathy   . Gout   . Cataract   . Basal cell carcinoma   . Fatty liver   . COPD (chronic obstructive pulmonary disease)   . Barrett esophagus   . Atrial fibrillation   . Pneumonia     PAST SURGICAL HISTORY:  Past Surgical History  Procedure Laterality Date  . Hip surgery Left   . Tongue cancer resection    . Cataract extraction    . Hip arthroplasty Bilateral   . Esopageal radio frequency ablation    . Cardiac catheterization N/A 12/01/2014    Procedure: Left Heart Cath;  Surgeon: Isaias Cowman, MD;  Location: [place] [facility];  Service: Cardiovascular;  Laterality: N/A;    Vital Signs: BP 90/66 mmHg  Pulse 103  Temp(Src) 97.4 F (36.3 C) (Oral)  Resp 20  Ht 6\' 4"  (1.93 m)  Wt 92.534 kg (204 lb)  BMI 24.84 kg/m2  SpO2 100% Filed Weights   11/28/14 0930  Weight: 92.534  kg (204 lb)    Estimated body mass index is 24.84 kg/(m^2) as calculated from the following:   Height as of this encounter: 6\' 4"  (1.93 m).   Weight as of this encounter: 92.534 kg (204 lb).  PHYSICAL EXAM: Mildly dyspneic in bedside chair with feet fully down EOMI OP clear Neck no JVD or thyromegaly Heart rrr no mgr Lungs with decreased air movement sounds in bases  Abd soft and nontender Ext tense 3plus edema bilat to hips.    LABS: CBC:    Component Value Date/Time   WBC 5.2 12/02/2014 0402   WBC 4.3 07/02/2014 1423   HGB 9.6* 12/02/2014 0402   HGB 13.8 07/02/2014 1423   HCT 29.7* 12/02/2014 0402   HCT 40.2 07/02/2014 1423   PLT 185 12/02/2014 0402   PLT 104* 07/02/2014 1423   MCV 77.3* 12/02/2014 0402   MCV 95 07/02/2014 1423   NEUTROABS 3.7 12/02/2014 0402   NEUTROABS 2.6 07/02/2014 1423   LYMPHSABS 0.9* 12/02/2014 0402   LYMPHSABS 0.9* 07/02/2014 1423   MONOABS 0.5 12/02/2014 0402   MONOABS 0.6 07/02/2014 1423   EOSABS 0.1 12/02/2014 0402   EOSABS 0.1 07/02/2014 1423   BASOSABS 0.1 12/02/2014 0402   BASOSABS 0.1 07/02/2014 1423   BASOSABS 1  11/20/2011 1259   Comprehensive Metabolic Panel:    Component Value Date/Time   NA 129* 12/02/2014 0402   NA 139 06/22/2014 0910   K 4.0 12/02/2014 0402   K 3.6 06/22/2014 0910   CL 90* 12/02/2014 0402   CL 103 06/22/2014 0910   CO2 27 12/02/2014 0402   CO2 26 06/22/2014 0910   BUN 20 12/02/2014 0402   BUN 8 06/22/2014 0910   CREATININE 0.93 12/02/2014 0402   CREATININE 0.68 06/22/2014 0910   GLUCOSE 159* 12/02/2014 0402   GLUCOSE 193* 06/22/2014 0910   CALCIUM 8.8* 12/02/2014 0402   CALCIUM 8.9 06/22/2014 0910   AST 45* 11/28/2014 0946   ALT 14* 11/28/2014 0946   ALKPHOS 68 11/28/2014 0946   BILITOT 0.8 11/28/2014 0946   PROT 7.0 11/28/2014 0946   ALBUMIN 3.3* 11/28/2014 0946    IMPRESSION: Acute NSTEMI Multi-Vessel Coronary Artery Disease Moderate to Severe (more severe than moderate per cadiology)  Aortic Stenosis Acute on Chronic Systolic Congestive Heart Failure --EF on echo shows worsening to 20% (it had been 40%) S/p Cardiac Cath by Dr Saralyn Pilar 12/01/14 --I spoke with him and saw cath results showing significant coronary artery disease.  Moderate Mitral Regurgitation Hyponatremia Anemia of chronic disease Diabetes Mellitus type 2 (Hgb A1C = 7.0)  PLAN: Patient has chosen to go to [facility] for CABG and also for possible AVR.  He and family did not seem to know much about the valvular component, but I reminded them that this is related to his primary symptom complaint and that they will need to focus not just on his coronary arteries but his overall heart function and the valves working as well as they can. I went over the physiology in basic terms and discussed the same things I talked about yesterday.  I was able to talk with his wife and adult children today and I answered their questions.  I did talk about possible Hospice, if things do not go well or if the things that are done at [facility] do not fix his problem of being too short of breath all the time to enjoy life.  I reinforced the importance of being realistic and getting the truth known about his condition, while also continuing to have some Hope.  Pt and family feel that he would be appropriate for Palliative care along with aggressive care, but that he is not ready for Hospice type of care at this time.  His discharge to [facility] will happen today or tomorrow most likely.  He will remain full code. He needs to NOT ask for a Foley once this is DCd (he had thought that a Foley would 'suck fluid out' and I corrected his misunderstanding about this and also the risk of infection with catheters.  Family and pt were appreciative of the information I provided.        More than 50% of the visit was spent in counseling/coordination of care: YES  Time Spent: 40 minutes.

## 2014-12-02 NOTE — Care Management (Signed)
Plans are to transfer to [facility] for CABG.

## 2014-12-02 NOTE — Progress Notes (Signed)
Pt's chair alarm sounded about 3 :30,  Pt disoriented and awoke having some shortness of breath.  RN came in when alarm sounded and told patient to take some deep breaths in through his nose.  Pt tried to take good deep breaths but was still breathing mostly through his mouth and shifting around in his chair.  Pt then went unresponsive.  His head fell back, eyes went wide and his skin color turned very pale.  Pt did not appear to be breathing, code was called.  Chest compressions were started in the chair. Team arrived, patient starting to come to, with team's help we were able to move him to the bed. Patients HR was in the 40's.  Respiratory therapy attempted to put on the nonrebreather.   Pt pushing the mask off his face.  Pt complaining that he was going to throw up and that he was suffocating.  Pt spitting up in the emesis bag.  Zofran was given. BP was higher than it has been during shift, 104/74, 100 % on 3.5L [place], HR in 90-100's.  Dr. Reece Levy spoke with on call [facility] internal doctor, Dr. Sabra Heck.  Dr. Sabra Heck ordered for patient to get 1 dose of 2 mg Haldol and for his nonbreather to be placed once he was able to relax, also patient can have another dose 30 minutes later if needed to help him stay calm. Pt was very alert and able to communicate once the code team had left.  The chaplain came in to visit with the patient. Pt very alert and oriented, he is very aware that Dr. Josefa Half and his team are coming along with Dr. Megan Salon with Palliative.  Pt's family will be at this meeting so they can decide his plan.  Pt refused the IV haldol and the nonbreather, he reports that he is calm and resting well now, he is not short of breath but will call us if that changes. He says he has about an hour left to sleep and would like to rest. Pt was placed on a continuous pulse ox on the cardiac monitor.  Pt's wife was called at 32 to be made aware of incident. Staff continue to monitor closely.

## 2014-12-02 NOTE — Discharge Instructions (Signed)
Transferred to [facility]

## 2014-12-02 NOTE — Consult Note (Signed)
Palliative Medicine Inpatient Consult Note   Name: [name] Date: 12/02/2014 MRN: [id number]  DOB: [birth year]  Referring Physician: Dr Felipa Furnace  Palliative Care consult requested for this 81 y.o. male for goals of medical therapy in patient with a recent MI and now with a low EF and severe aortic stenosis as seen on echo.  He has AFib as well and has had a cath showing multi-vessel CAD.  I spoke with Dr. Saralyn Pilar before talking with patient about his condition and also about palliative care approaches as well as aggressive care for his chief complaint which is dyspnea and fluid retention.      REVIEW OF SYSTEMS:  All other systems were reviewed and found to be negative  SPIRITUAL SUPPORT SYSTEM: Yes.  SOCIAL HISTORY:  reports that he quit smoking about 45 years ago. His smoking use included Cigarettes. He has never used smokeless tobacco. He reports that he drinks alcohol. He reports that he does not use illicit drugs.  LEGAL DOCUMENTS:  None found thus far  CODE STATUS: Full code   --He is not ready to change this at this time.  Wants to hear options tomorrow first  PAST MEDICAL HISTORY: Past Medical History  Diagnosis Date  . Diabetes mellitus without complication   . Hypertension   . Reflux   . CHF (congestive heart failure), NYHA class III 10/31/2014  . Hyperlipemia   . Glaucoma   . Generalized OA   . Colon polyp   . Tongue cancer   . DDD (degenerative disc disease), cervical   . Cytopenia   . CAD (coronary artery disease)   . Environmental allergies   . Aortic stenosis   . Neuropathy   . Gout   . Cataract   . Basal cell carcinoma   . Fatty liver   . COPD (chronic obstructive pulmonary disease)   . Barrett esophagus   . Atrial fibrillation   . Pneumonia     PAST SURGICAL HISTORY:  Past Surgical History  Procedure Laterality Date  . Hip surgery Left   . Tongue cancer resection    . Cataract extraction    . Hip arthroplasty Bilateral   .  Esopageal radio frequency ablation    . Cardiac catheterization N/A 12/01/2014    Procedure: Left Heart Cath;  Surgeon: Isaias Cowman, MD;  Location: [place] [facility];  Service: Cardiovascular;  Laterality: N/A;    ALLERGIES:  is allergic to cefdinir; cephalexin; codeine; doxycycline; and tape.  MEDICATIONS:  Current Facility-Administered Medications  Medication Dose Route Frequency Provider Last Rate Last Dose  . 0.9 %  sodium chloride infusion  250 mL Intravenous PRN Dustin Flock, MD      . acetaminophen (TYLENOL) tablet 650 mg  650 mg Oral Q6H PRN Dustin Flock, MD      . aspirin EC tablet 81 mg  81 mg Oral Daily Dustin Flock, MD   81 mg at 12/02/14 1029  . budesonide-formoterol (SYMBICORT) 160-4.5 MCG/ACT inhaler 2 puff  2 puff Inhalation BID Dustin Flock, MD   2 puff at 12/02/14 1205  . cholecalciferol (VITAMIN D) tablet 2,000 Units  2,000 Units Oral Daily Dustin Flock, MD   2,000 Units at 12/02/14 1028  . docusate sodium (COLACE) capsule 100 mg  100 mg Oral BID Dustin Flock, MD   100 mg at 12/02/14 1028  . furosemide (LASIX) injection 80 mg  80 mg Intravenous BID Adrian Prows, MD   80 mg at 12/01/14 1739  . glipiZIDE (GLUCOTROL  XL) 24 hr tablet 10 mg  10 mg Oral Q breakfast Dustin Flock, MD   10 mg at 12/02/14 1029  . ipratropium (ATROVENT) 0.03 % nasal spray 2 spray  2 spray Nasal BID Dustin Flock, MD   2 spray at 12/02/14 1206  . ipratropium-albuterol (DUONEB) 0.5-2.5 (3) MG/3ML nebulizer solution 3 mL  3 mL Nebulization Q4H PRN Adrian Prows, MD   3 mL at 12/01/14 0007  . metoprolol succinate (TOPROL-XL) 24 hr tablet 25 mg  25 mg Oral Daily Dustin Flock, MD   Stopped at 11/30/14 0902  . montelukast (SINGULAIR) tablet 10 mg  10 mg Oral Daily Dustin Flock, MD   10 mg at 12/02/14 1029  . multivitamin with minerals tablet 1 tablet  1 tablet Oral Daily Dustin Flock, MD   1 tablet at 12/02/14 1029  . omega-3 acid ethyl esters (LOVAZA) capsule 1,000 mg   1,000 mg Oral Daily Dustin Flock, MD   1,000 mg at 12/02/14 1029  . ondansetron (ZOFRAN) tablet 4 mg  4 mg Oral Q6H PRN Dustin Flock, MD      . pantoprazole (PROTONIX) EC tablet 40 mg  40 mg Oral Daily Dustin Flock, MD   40 mg at 12/02/14 1029  . potassium chloride (KLOR-CON) packet 20 mEq  20 mEq Oral BID Idelle Crouch, MD   20 mEq at 12/02/14 1029  . simvastatin (ZOCOR) tablet 20 mg  20 mg Oral QHS Dustin Flock, MD   20 mg at 12/01/14 2108  . sodium chloride 0.9 % injection 3 mL  3 mL Intravenous Q12H Dustin Flock, MD   3 mL at 12/02/14 1206  . sodium chloride 0.9 % injection 3 mL  3 mL Intravenous PRN Dustin Flock, MD      . timolol (TIMOPTIC) 0.5 % ophthalmic solution 1 drop  1 drop Both Eyes Daily Dustin Flock, MD   1 drop at 12/02/14 1204  . vitamin E capsule 400 Units  400 Units Oral Daily Dustin Flock, MD   400 Units at 12/02/14 1029   Current Outpatient Prescriptions  Medication Sig Dispense Refill  . Cholecalciferol (VITAMIN D3) 2000 UNITS capsule Take 2,000 Units by mouth daily.    Marland Kitchen docusate sodium (COLACE) 100 MG capsule Take 1 capsule (100 mg total) by mouth 2 (two) times daily. (Patient taking differently: Take 100 mg by mouth 2 (two) times daily as needed for mild constipation or moderate constipation. ) 10 capsule 0  . ELIQUIS 5 MG TABS tablet Take 5 mg by mouth 2 (two) times daily.     Marland Kitchen omeprazole (PRILOSEC) 40 MG capsule Take 40 mg by mouth daily.    . potassium chloride (KLOR-CON) 20 MEQ packet Take 20 mEq by mouth 2 (two) times daily. (Patient taking differently: Take 20 mEq by mouth daily. ) 60 tablet 5  . simvastatin (ZOCOR) 20 MG tablet Take 20 mg by mouth at bedtime.    . SYMBICORT 160-4.5 MCG/ACT inhaler Inhale 2 puffs into the lungs 2 (two) times daily.  12  . TOPROL XL 25 MG 24 hr tablet Take 25 mg by mouth daily.     . vitamin E 400 UNIT capsule Take 400 Units by mouth daily.    Marland Kitchen aspirin 81 MG chewable tablet Chew 1 tablet (81 mg total) by mouth  before cath procedure. 30 tablet 5  . B Complex Vitamins (VITAMIN B COMPLEX) TABS Take 1 tablet by mouth daily.    . furosemide (LASIX) 10 MG/ML injection Inject 8  mLs (80 mg total) into the vein 2 (two) times daily. 4 mL 0  . glipiZIDE (GLIPIZIDE XL) 10 MG 24 hr tablet Take 1 tablet (10 mg total) by mouth daily with breakfast. 30 tablet 5  . ipratropium-albuterol (DUONEB) 0.5-2.5 (3) MG/3ML SOLN Take 3 mLs by nebulization every 4 (four) hours as needed. 360 mL 5  . montelukast (SINGULAIR) 10 MG tablet Take 10 mg by mouth daily.     . Multiple Vitamins-Minerals (CENTRUM SILVER) tablet Take 1 tablet by mouth daily.    . Omega-3 Fatty Acids (EQL OMEGA 3 FISH OIL) 1200 MG CAPS Take 1,200 mg by mouth daily.    . ondansetron (ZOFRAN) 4 MG tablet Take 1 tablet (4 mg total) by mouth every 6 (six) hours as needed for nausea. 20 tablet 0  . sodium chloride 0.9 % infusion Inject 250 mLs into the vein as needed (for IV line care(Saline / Heparin Lock)). 100 mL 0  . timolol (TIMOPTIC) 0.5 % ophthalmic solution Place 1 drop into both eyes daily. (Patient not taking: Reported on 11/29/2014) 10 mL 12    Vital Signs: BP 90/66 mmHg  Pulse 103  Temp(Src) 97.4 F (36.3 C) (Oral)  Resp 20  Ht 6\' 4"  (1.93 m)  Wt 92.534 kg (204 lb)  BMI 24.84 kg/m2  SpO2 100% Filed Weights   11/28/14 0930  Weight: 92.534 kg (204 lb)    Estimated body mass index is 24.84 kg/(m^2) as calculated from the following:   Height as of this encounter: 6\' 4"  (1.93 m).   Weight as of this encounter: 92.534 kg (204 lb).  PERFORMANCE STATUS (ECOG) : 3 - Symptomatic, >50% confined to bed  PHYSICAL EXAM: Mildly dyspneic in bedside chair with feet fully down EOMI OP clear Neck no JVD or thyromegaly Heart rrr no mgr Lungs with decreased air movement sounds in bases  Abd soft and nontender Ext tense 3plus edema bilat to hips.   LABS: CBC:    Component Value Date/Time   WBC 5.2 12/02/2014 0402   WBC 4.3 07/02/2014 1423   HGB  9.6* 12/02/2014 0402   HGB 13.8 07/02/2014 1423   HCT 29.7* 12/02/2014 0402   HCT 40.2 07/02/2014 1423   PLT 185 12/02/2014 0402   PLT 104* 07/02/2014 1423   MCV 77.3* 12/02/2014 0402   MCV 95 07/02/2014 1423   NEUTROABS 3.7 12/02/2014 0402   NEUTROABS 2.6 07/02/2014 1423   LYMPHSABS 0.9* 12/02/2014 0402   LYMPHSABS 0.9* 07/02/2014 1423   MONOABS 0.5 12/02/2014 0402   MONOABS 0.6 07/02/2014 1423   EOSABS 0.1 12/02/2014 0402   EOSABS 0.1 07/02/2014 1423   BASOSABS 0.1 12/02/2014 0402   BASOSABS 0.1 07/02/2014 1423   BASOSABS 1 11/20/2011 1259   Comprehensive Metabolic Panel:    Component Value Date/Time   NA 129* 12/02/2014 0402   NA 139 06/22/2014 0910   K 4.0 12/02/2014 0402   K 3.6 06/22/2014 0910   CL 90* 12/02/2014 0402   CL 103 06/22/2014 0910   CO2 27 12/02/2014 0402   CO2 26 06/22/2014 0910   BUN 20 12/02/2014 0402   BUN 8 06/22/2014 0910   CREATININE 0.93 12/02/2014 0402   CREATININE 0.68 06/22/2014 0910   GLUCOSE 159* 12/02/2014 0402   GLUCOSE 193* 06/22/2014 0910   CALCIUM 8.8* 12/02/2014 0402   CALCIUM 8.9 06/22/2014 0910   AST 45* 11/28/2014 0946   ALT 14* 11/28/2014 0946   ALKPHOS 68 11/28/2014 0946   BILITOT 0.8 11/28/2014 0946  PROT 7.0 11/28/2014 0946   ALBUMIN 3.3* 11/28/2014 0946    IMPRESSION: Acute NSTEMI Multi-Vessel Coronary Artery Disease Moderate to Severe (more severe than moderate per cadiology) Aortic Stenosis Acute on Chronic Systolic Congestive Heart Failure --EF on echo shows worsening to 20% (it had been 40%) S/p Cardiac Cath by Dr Saralyn Pilar 12/01/14 --I spoke with him and saw cath results showing significant coronary artery disease.   Moderate Mitral Regurgitation Hyponatremia Anemia of chronic disease Diabetes Mellitus type 2 (Hgb A1C = 7.0)     PLAN: The patient's cardiac issues will be discussed in a conference early in the am by Dr. Saralyn Pilar.  The patient's focuse is on being able to breathe and get rid of fluid.  He  does not seem to care so much about the fact that his coronary vessels are occuluded, but he cares quite a lot about the fluid he can't seem to get out of his lungs and legs.  I spend at least 50 minutes educating him about his valve disease and poorly functioning cardiac muscle with low EF.  He may need aggressive care OR else Hospice.  He was on Hospice ONCE (but he says that he was on it just because it was a good deal and he says he was told he was 'NOT Terminal' .  He dismissed Hospice at the time, saying he was having a bad day and they seemed overly focused on his dying,  He is not ready at this time to choose comfort care.  He says he wants our science to be able to fix his breathing.  I told him the blunt truth about his illness and that he would qualify for being 'terminal' --BUT he was reasssured that his choices about treatment matter greatly and that we will have to discuss options after the conference.  The patient will talk to Dr. Saralyn Pilar first tomorrow and then he will ask the nurse to call me so that I can talk to them about the fluid issue --which is his main complaint.      More than 50% of the visit was spent in counseling/coordination of care: Yes  Time Spent: 70 minutes

## 2014-12-02 NOTE — Progress Notes (Signed)
CODE BLUE was called by the nurse as the patient was TO BE UNRESPONSIVE WITH HEART RATE IN THE 40S. BY THE TIME WE SAW THE PATIENT, PATIENT WAS ALERT AWAKE BUT RESTLESS AND HEART RATE RANGING BETWEEN 80S TO 130S, O2 saturations 96% on room air. Patient complained of nausea/vomiting. Moving all 4 limbs, denies any chest pain, shortness of breath. Zofran 4 mg IV given.  Dr. Sabra Heck covering for Dr. Doy Hutching was contacted by the nurse who recommended Haldol and advised close monitoring at this time. Patient is alert awake and oriented with stable vitals at this time, hence advised aren't too closely monitor the patient.

## 2014-12-02 NOTE — Progress Notes (Signed)
   12/02/14 0330  Clinical Encounter Type  Visited With Patient  Visit Type Code  Referral From Nurse  Consult/Referral To Chaplain  Spiritual Encounters  Spiritual Needs Prayer;Emotional  Stress Factors  Patient Stress Factors Exhausted;Health changes;Loss of control;Major life changes  Responded to CODE BLUE. Response team monitored patient who regained consciousness and quickly regained orientation to surroundings. Chaplain sat with patient after health care team departed. Patient continued to stabilize and slept.  Chap. Lexie Koehl G. [place]

## 2014-12-02 NOTE — Discharge Summary (Signed)
[name], is a 81 y.o. male  DOB 1932/04/30  MRN [id number].  Admission date:  11/28/2014  Admitting Physician  Idelle Crouch, MD  Discharge Date:  12/02/2014   Primary MD  Jacci Ruberg D, MD  Recommendations for primary care physician for things to follow:   none   Admission Diagnosis  Acute pulmonary edema [J81.0]   Discharge Diagnosis  Acute pulmonary edema [J81.0]  NSTEMI, cardiomyopathy, aortic stenosis  Active Problems:   Acute CHF   Malnutrition of moderate degree      Past Medical History  Diagnosis Date  . Diabetes mellitus without complication   . Hypertension   . Reflux   . CHF (congestive heart failure), NYHA class III 10/31/2014  . Hyperlipemia   . Glaucoma   . Generalized OA   . Colon polyp   . Tongue cancer   . DDD (degenerative disc disease), cervical   . Cytopenia   . CAD (coronary artery disease)   . Environmental allergies   . Aortic stenosis   . Neuropathy   . Gout   . Cataract   . Basal cell carcinoma   . Fatty liver   . COPD (chronic obstructive pulmonary disease)   . Barrett esophagus   . Atrial fibrillation   . Pneumonia     Past Surgical History  Procedure Laterality Date  . Hip surgery Left   . Tongue cancer resection    . Cataract extraction    . Hip arthroplasty Bilateral   . Esopageal radio frequency ablation    . Cardiac catheterization N/A 12/01/2014    Procedure: Left Heart Cath;  Surgeon: Isaias Cowman, MD;  Location: [facility];  Service: Cardiovascular;  Laterality: N/A;       History of present illness and  Hospital Course:     Kindly see H&P for history of present illness and admission details, please review complete Labs, Consult reports and Test reports for all details in brief  HPI  from the history and physical done on the day of admission    Hospital Course    Pt admitted with SOB, acute on  chronic CHF with resp. Failure. Troponin elevated c/w NSTEMI. Echo much worse. Diuresed with IV Lasix. Cardiac cath reveals 2V CAD with moderate to severe AS. Now transferred to [facility] for CABG and possible valve replacement.   Discharge Condition: stable   Follow UP  Follow-up Information    Follow up with Abcde Oneil D, MD In 1 week.   Specialty:  Internal Medicine   Contact information:   [place] [address] [phone]         Discharge Instructions  and  Discharge Medications   See below     Medication List    STOP taking these medications        amoxicillin-clavulanate 875-125 MG per tablet  Commonly known as:  AUGMENTIN     furosemide 40 MG tablet  Commonly known as:  LASIX  Replaced by:  furosemide 10 MG/ML  injection     glucose blood test strip     ipratropium 0.03 % nasal spray  Commonly known as:  ATROVENT      TAKE these medications        aspirin 81 MG chewable tablet  Chew 1 tablet (81 mg total) by mouth before cath procedure.     CENTRUM SILVER tablet  Take 1 tablet by mouth daily.     docusate sodium 100 MG capsule  Commonly known as:  COLACE  Take 1 capsule (100 mg total) by mouth 2 (two) times daily.     ELIQUIS 5 MG Tabs tablet  Generic drug:  apixaban  Take 5 mg by mouth 2 (two) times daily.     EQL OMEGA 3 FISH OIL 1200 MG Caps  Take 1,200 mg by mouth daily.     furosemide 10 MG/ML injection  Commonly known as:  LASIX  Inject 8 mLs (80 mg total) into the vein 2 (two) times daily.     glipiZIDE 10 MG 24 hr tablet  Commonly known as:  GLIPIZIDE XL  Take 1 tablet (10 mg total) by mouth daily with breakfast.     ipratropium-albuterol 0.5-2.5 (3) MG/3ML Soln  Commonly known as:  DUONEB  Take 3 mLs by nebulization every 4 (four) hours as needed.     montelukast 10 MG tablet  Commonly known as:  SINGULAIR  Take 10 mg by mouth daily.     omeprazole 40 MG capsule  Commonly known as:  PRILOSEC  Take 40  mg by mouth daily.     ondansetron 4 MG tablet  Commonly known as:  ZOFRAN  Take 1 tablet (4 mg total) by mouth every 6 (six) hours as needed for nausea.     potassium chloride 20 MEQ packet  Commonly known as:  KLOR-CON  Take 20 mEq by mouth 2 (two) times daily.     simvastatin 20 MG tablet  Commonly known as:  ZOCOR  Take 20 mg by mouth at bedtime.     sodium chloride 0.9 % infusion  Inject 250 mLs into the vein as needed (for IV line care(Saline / Heparin Lock)).     SYMBICORT 160-4.5 MCG/ACT inhaler  Generic drug:  budesonide-formoterol  Inhale 2 puffs into the lungs 2 (two) times daily.     timolol 0.5 % ophthalmic solution  Commonly known as:  TIMOPTIC  Place 1 drop into both eyes daily.     TOPROL XL 25 MG 24 hr tablet  Generic drug:  metoprolol succinate  Take 25 mg by mouth daily.     Vitamin B Complex Tabs  Take 1 tablet by mouth daily.     Vitamin D3 2000 UNITS capsule  Take 2,000 Units by mouth daily.     vitamin E 400 UNIT capsule  Take 400 Units by mouth daily.          Diet and Activity recommendation: See Discharge Instructions above   Consults obtained - Cardiology, PC, CSW, PT   Major procedures and Radiology Reports - PLEASE review detailed and final reports for all details, in brief -   See below   Dg Chest 2 View  12/02/2014   CLINICAL DATA:  Congestive heart failure, aortic stenosis hypertension, shortness of breath  EXAM: CHEST  2 VIEW  COMPARISON:  11/30/2014  FINDINGS: Moderate cardiac enlargement. Vascular congestion with indistinct perihilar vessels bilaterally. Moderate diffuse interstitial prominence. Tiny bilateral pleural effusions. Peribronchial wall thickening stable.  IMPRESSION: No significant change  from prior study with findings again consistent with pulmonary edema related to congestive heart failure   Electronically Signed   By: Skipper Cliche M.D.   On: 12/02/2014 11:39   Dg Chest 2 View  11/30/2014   CLINICAL DATA:   Shortness of breath especially with exertion. History of CHF, CAD, hypertension diabetes. Aortic stenosis.  EXAM: CHEST  2 VIEW  COMPARISON:  11/28/2014, 11/01/2014, and chest CT 11/02/2014  FINDINGS: Stable heart and mediastinal contours. Mild prominence of the central pulmonary vascularity. Atherosclerotic calcification of the the thoracic aorta. Bilateral perihilar and bibasilar hazy/patchy opacities are without significant interval change compared to chest radiograph dated 11/28/2014.  Small bilateral pleural effusions and thickening of the fissures.  Chronic height loss of mid thoracic spine vertebral body and multilevel degenerative changes of the thoracic spine.  IMPRESSION: No significant change in bilateral perihilar and basilar airspace opacities and small bilateral pleural effusions compared to the chest radiograph of 11/28/2014.   Electronically Signed   By: Curlene Dolphin M.D.   On: 11/30/2014 09:50   Dg Chest Portable 1 View  11/28/2014   CLINICAL DATA:  Short of breath. Progressive shortness of breath with exertion.  EXAM: PORTABLE CHEST - 1 VIEW  COMPARISON:  11/02/2014.  10/26/2014.  FINDINGS: Cardiopericardial silhouette borderline for projection. Aortic arch atherosclerosis.  Perihilar and basilar predominant airspace disease is present, most compatible with pulmonary edema. Multifocal pneumonia is in the differential considerations. Given the prior CT scans, some of this may represent post infectious/inflammatory changes or ARDS. Small bilateral pleural effusions are present with blunting of both costophrenic angles.  When compared to chest radiograph 11/01/2014, pulmonary aeration is improved.  IMPRESSION: Bilateral basilar and perihilar predominant airspace disease most compatible with pulmonary edema. This is probably superimposed on chronic interstitial and airspace opacities identified on prior exams. Recurrent/progressive Multifocal pneumonia is considered unlikely, particularly given the  improvement from prior chest radiograph and CT.   Electronically Signed   By: Dereck Ligas M.D.   On: 11/28/2014 10:09    Micro Results   See below  Recent Results (from the past 240 hour(s))  Culture, blood (routine x 2)     Status: None (Preliminary result)   Collection Time: 11/28/14  9:47 AM  Result Value Ref Range Status   Specimen Description BLOOD  Final   Special Requests NONE  Final   Culture NO GROWTH 4 DAYS  Final   Report Status PENDING  Incomplete  Culture, blood (routine x 2)     Status: None (Preliminary result)   Collection Time: 11/28/14  9:47 AM  Result Value Ref Range Status   Specimen Description BLOOD  Final   Special Requests NONE  Final   Culture NO GROWTH 4 DAYS  Final   Report Status PENDING  Incomplete       Today   Subjective:   [name] c/o severe fatigue, weakness, and dyspnea. Agrees to cardiovascular surgery and transfer to [facility].  Objective:   Blood pressure 90/66, pulse 103, temperature 97.4 F (36.3 C), temperature source Oral, resp. rate 20, height 6\' 4"  (1.93 m), weight 92.534 kg (204 lb), SpO2 100 %.   Intake/Output Summary (Last 24 hours) at 12/02/14 1213 Last data filed at 12/02/14 0830  Gross per 24 hour  Intake   1130 ml  Output    600 ml  Net    530 ml    Exam Awake Alert, Oriented x 3, No new F.N deficits, Normal affect [place].AT,PERRAL Supple Neck,No JVD,  No cervical lymphadenopathy appriciated.  Symmetrical Chest wall movement, decreased breath sounds with basilar rales. Heart Irregularly irregular,No Gallops,Rubs, No Parasternal Heave +ve B.Sounds, Abd Soft, Non tender, No organomegaly appriciated, No rebound -guarding or rigidity. No Cyanosis, Clubbing . 1+ edema noted. No new Rash or bruise  Data Review   CBC w Diff: Lab Results  Component Value Date   WBC 5.2 12/02/2014   WBC 4.3 07/02/2014   HGB 9.6* 12/02/2014   HGB 13.8 07/02/2014   HCT 29.7* 12/02/2014   HCT 40.2 07/02/2014   PLT 185 12/02/2014    PLT 104* 07/02/2014   LYMPHOPCT 18 12/02/2014   LYMPHOPCT 21.2 07/02/2014   MONOPCT 9 12/02/2014   MONOPCT 13.5 07/02/2014   EOSPCT 1 12/02/2014   EOSPCT 3.2 07/02/2014   BASOPCT 1 12/02/2014   BASOPCT 1.2 07/02/2014    CMP: Lab Results  Component Value Date   NA 129* 12/02/2014   NA 139 06/22/2014   K 4.0 12/02/2014   K 3.6 06/22/2014   CL 90* 12/02/2014   CL 103 06/22/2014   CO2 27 12/02/2014   CO2 26 06/22/2014   BUN 20 12/02/2014   BUN 8 06/22/2014   CREATININE 0.93 12/02/2014   CREATININE 0.68 06/22/2014   PROT 7.0 11/28/2014   ALBUMIN 3.3* 11/28/2014   BILITOT 0.8 11/28/2014   ALKPHOS 68 11/28/2014   AST 45* 11/28/2014   ALT 14* 11/28/2014  .   Total Time in preparing paper work, data evaluation and todays exam - 66 minutes  Pharoah Goggins D M.D on 12/02/2014 at 12:13 PM

## 2014-12-02 NOTE — Care Management (Signed)
Important Message  Patient Details  Name: [name] MRN: [id number] Date of Birth: Dec 16, 1931   Medicare Important Message Given:  Yes-third notification given    [name] 12/02/2014, 12:28 PM

## 2014-12-03 LAB — CULTURE, BLOOD (ROUTINE X 2)
CULTURE: NO GROWTH
Culture: NO GROWTH

## 2014-12-27 DEATH — deceased

## 2015-08-22 IMAGING — CT CT CHEST W/O CM
1 series · 15 of 31 positions shown, 19 images · non-contrast
Comparison: 10/26/2014

CLINICAL DATA: Worsening shortness of breath. Increased peripheral
edema. Acute on chronic congestive heart failure, Giorgi Jumper 3

EXAM:
CT CHEST WITHOUT CONTRAST
TECHNIQUE: Multidetector CT imaging of the chest was performed following the
standard protocol without IV contrast.

[Series 6: routine chest wo · axial · 0.73mm/px · z∈[-237,+103]mm · 15 of 74 slices shown, 19 images]
[im 3/74  mediastinal]
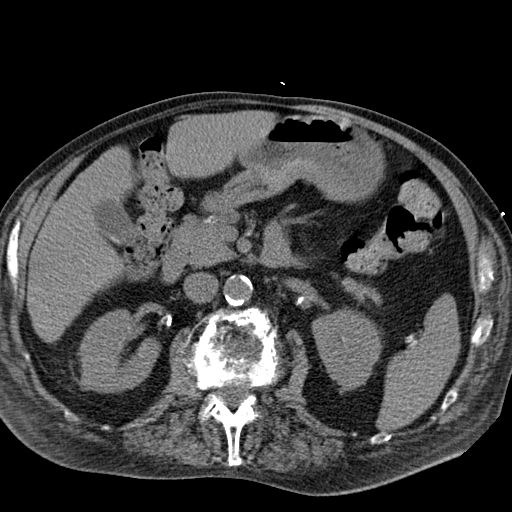
[im 3/74  lung]
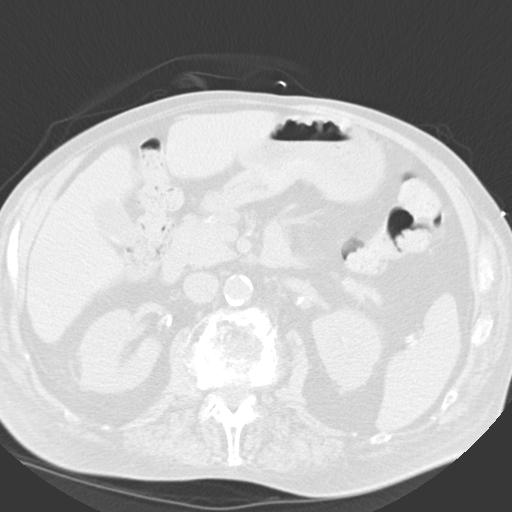
[im 9/74  lung]
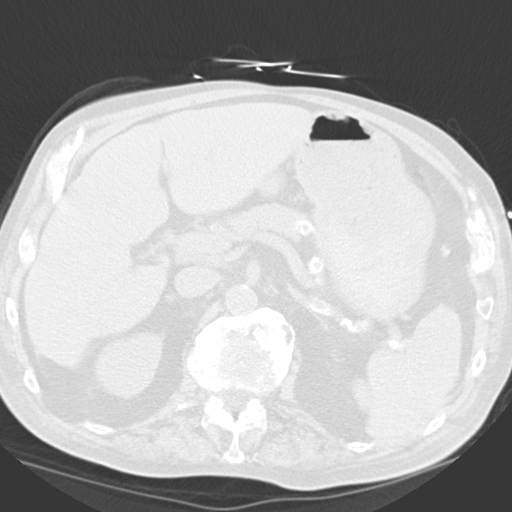
[im 14/74  lung]
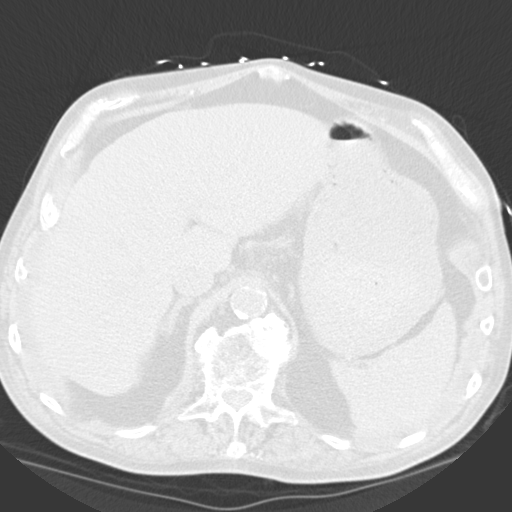
[im 17/74  lung]
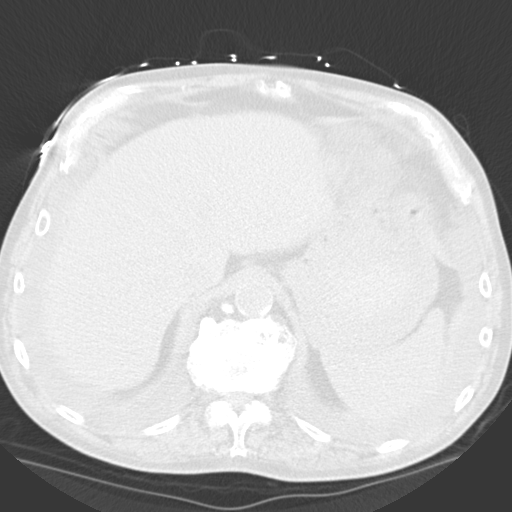
[im 22/74  mediastinal]
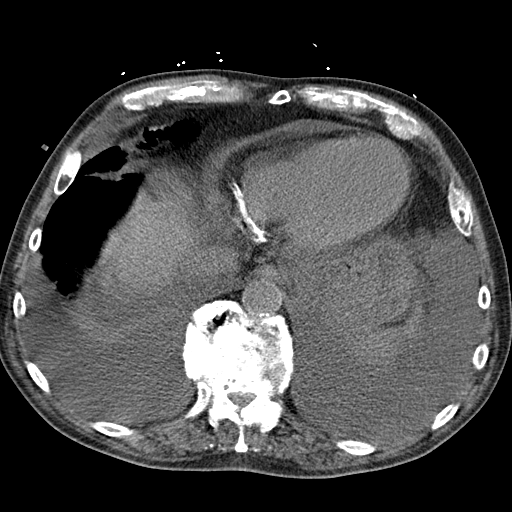
[im 22/74  lung]
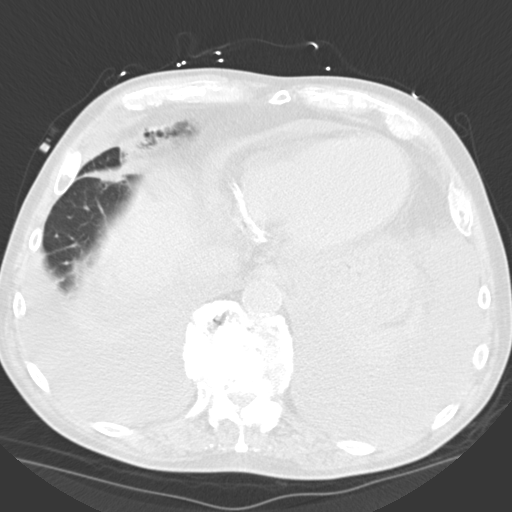
[im 28/74  lung]
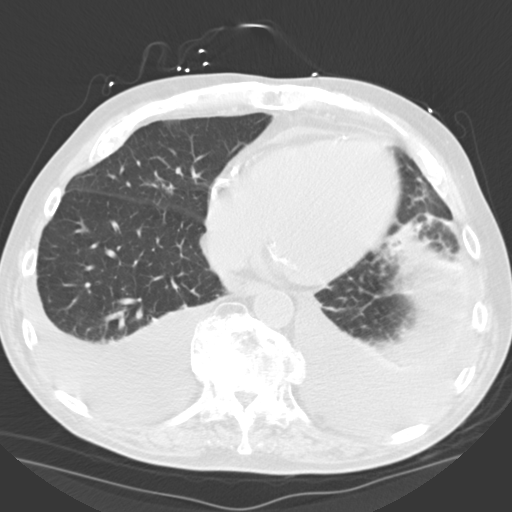
[im 33/74  lung]
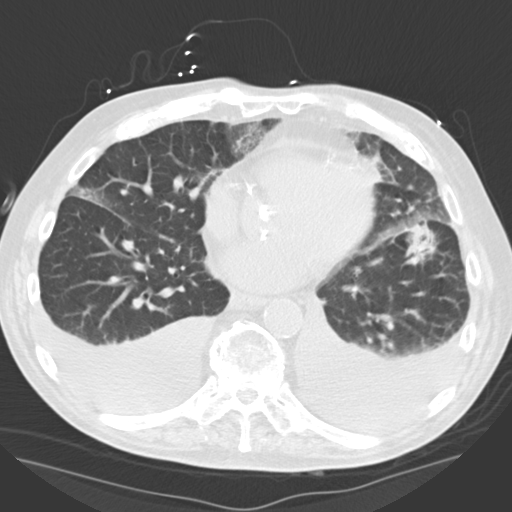
[im 38/74  lung]
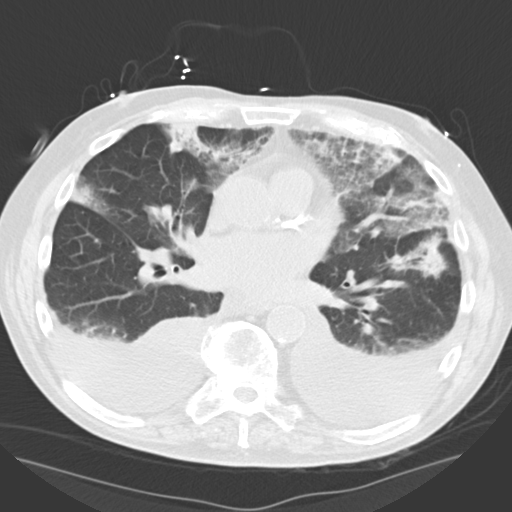
[im 41/74  mediastinal]
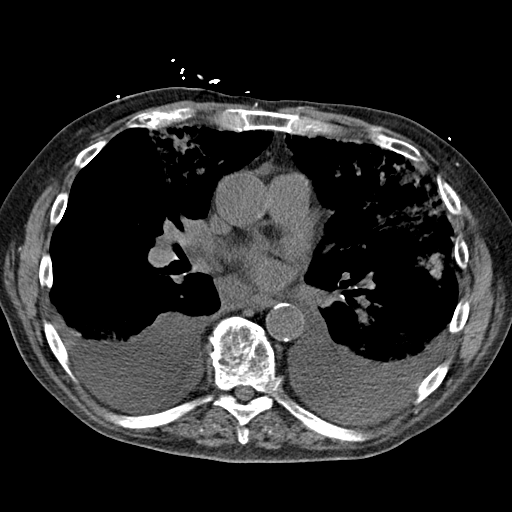
[im 41/74  lung]
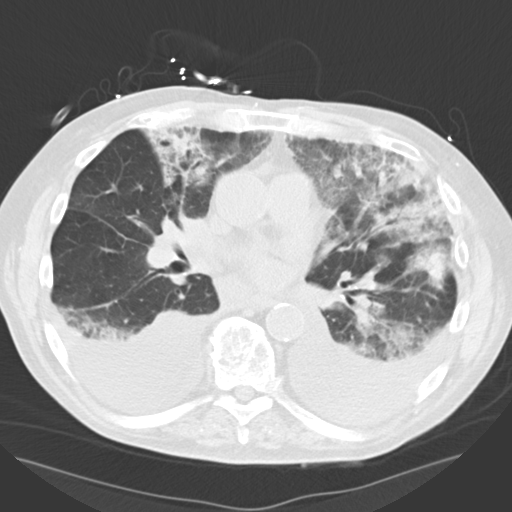
[im 46/74  lung]
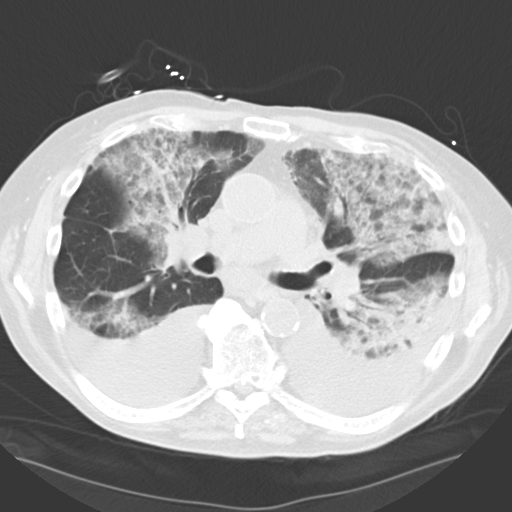
[im 52/74  lung]
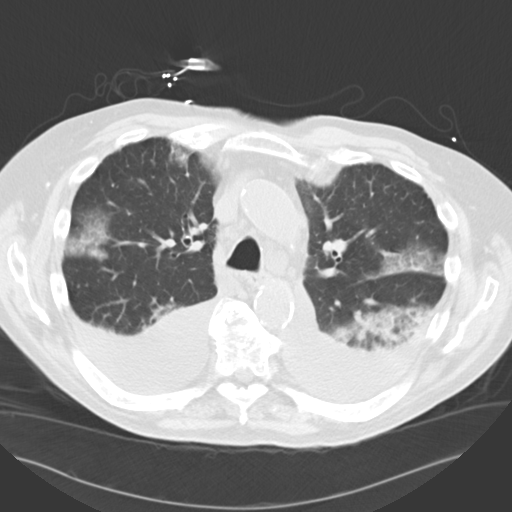
[im 57/74  lung]
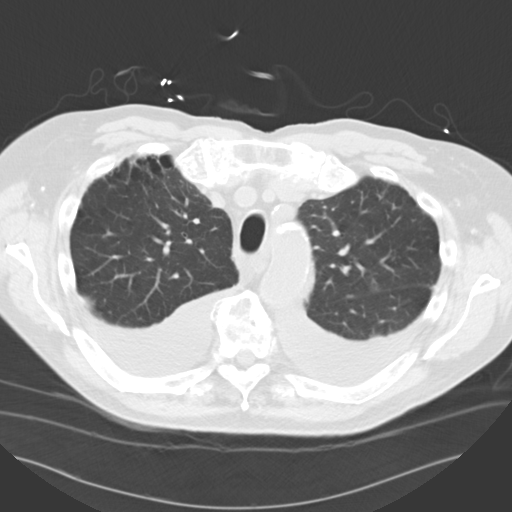
[im 60/74  mediastinal]
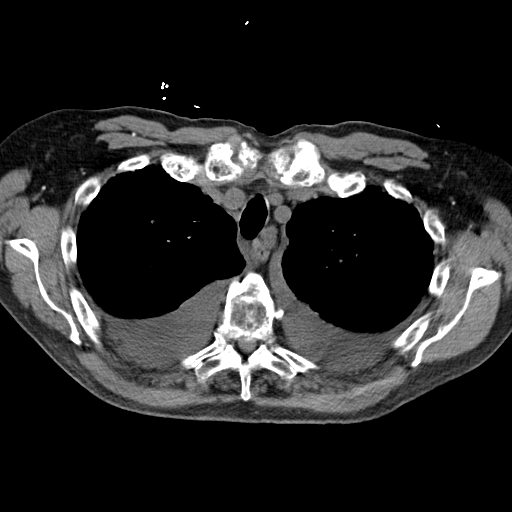
[im 60/74  lung]
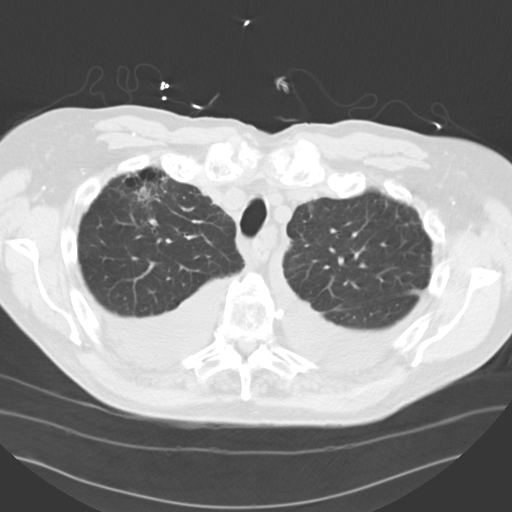
[im 65/74  lung]
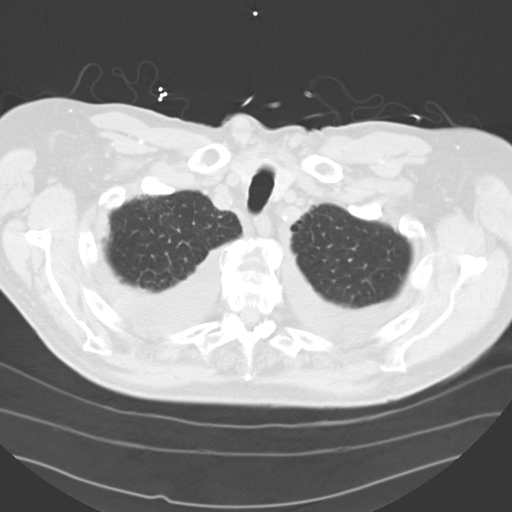
[im 71/74  lung]
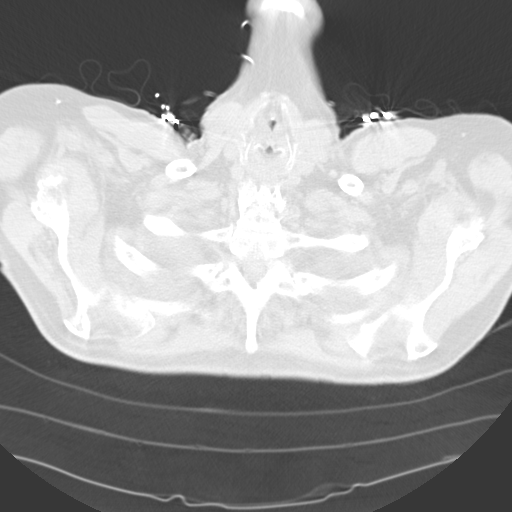

[15 of 31 positions shown; findings below may reference images not displayed]

FINDINGS: Mediastinum/Lymph Nodes: Mild mediastinal lymphadenopathy is stable
and nonspecific. This may be reactive in etiology.

Lungs/Pleura: Moderate bilateral pleural effusions show no
significant change in size. Bilateral heterogeneous pulmonary
airspace disease is again seen. There is mild worsening seen in the
right upper lobe, but otherwise this remains stable. This may be due
to diffuse pulmonary edema, although pneumonia or other inflammatory
process cannot definitely be excluded.

Musculoskeletal/Soft Tissues: No suspicious bone lesions or other
significant chest wall abnormality.

Upper Abdomen: Tiny calcified gallstones again noted. Enlargement of
left and caudate lobes of liver again demonstrated, suspicious for
hepatic cirrhosis.
IMPRESSION: Bilateral multi lobar heterogeneous pulmonary airspace disease, with
mild interval worsening seen in the right upper lobe. This is likely
due to diffuse pulmonary edema although pneumonia or other
inflammatory process cannot be excluded.

No significant change in moderate bilateral pleural effusions.

Stable nonspecific mild mediastinal lymphadenopathy, likely reactive
in etiology.

Cholelithiasis.  No radiographic evidence of cholecystitis.

Probable hepatic cirrhosis.

## 2015-09-17 IMAGING — CR DG CHEST 1V PORT
1 series · 1 of 1 positions shown · non-contrast
Comparison: 11/02/2014.  10/26/2014.

CLINICAL DATA: Short of breath. Progressive shortness of breath
with exertion.

EXAM:
PORTABLE CHEST - 1 VIEW

[ap]
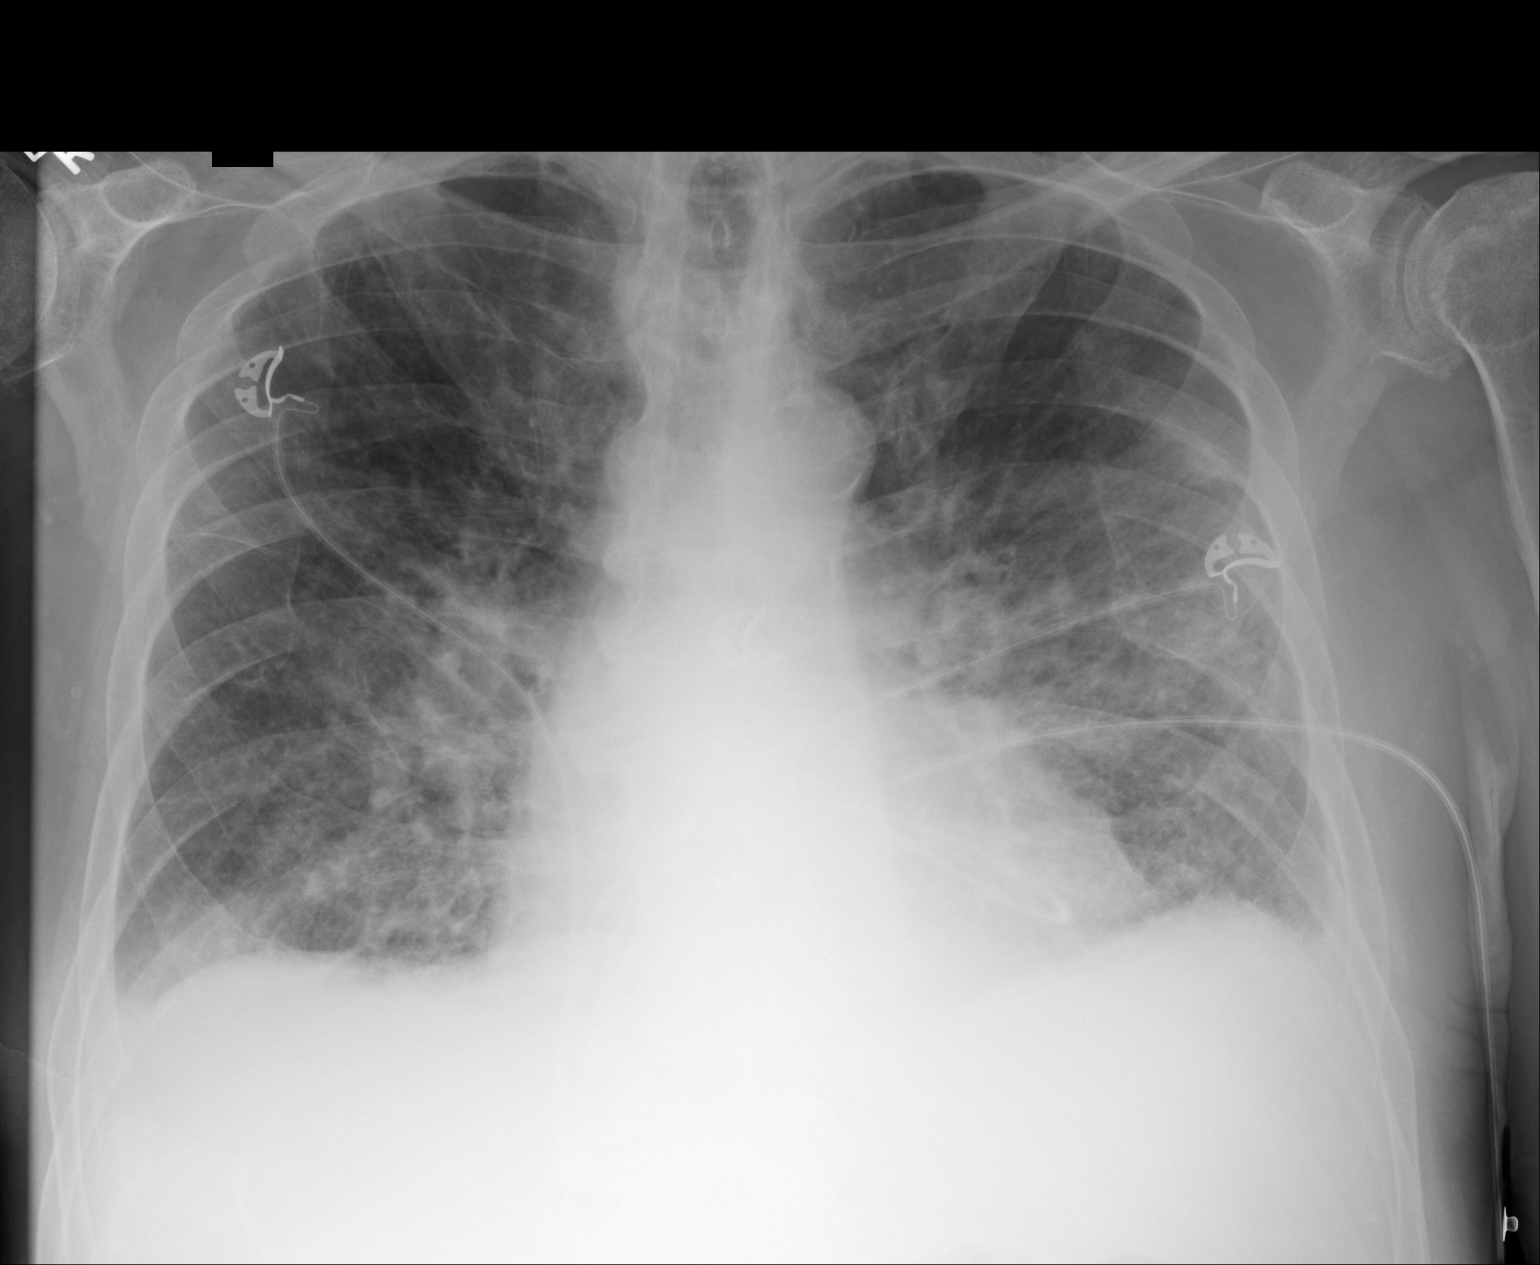

[1 of 1 positions shown; findings below may reference images not displayed]

FINDINGS: Cardiopericardial silhouette borderline for projection. Aortic arch
atherosclerosis.

Perihilar and basilar predominant airspace disease is present, most
compatible with pulmonary edema. Multifocal pneumonia is in the
differential considerations. Given the prior CT scans, some of this
may represent post infectious/inflammatory changes or ARDS. Small
bilateral pleural effusions are present with blunting of both
costophrenic angles.

When compared to chest radiograph 11/01/2014, pulmonary aeration is
improved.
IMPRESSION: Bilateral basilar and perihilar predominant airspace disease most
compatible with pulmonary edema. This is probably superimposed on
chronic interstitial and airspace opacities identified on prior
exams. Recurrent/progressive Multifocal pneumonia is considered
unlikely, particularly given the improvement from prior chest
radiograph and CT.

## 2015-09-19 IMAGING — CR DG CHEST 2V
1 series · 2 of 2 positions shown · non-contrast
Comparison: 11/28/2014, 11/01/2014, and chest CT 11/02/2014

CLINICAL DATA: Shortness of breath especially with exertion.
History of CHF, CAD, hypertension diabetes. Aortic stenosis.

EXAM:
CHEST  2 VIEW

[Series 1: dg chest 2 view · 0.14mm/px · 2 of 2 slices shown]
[im 1/2]
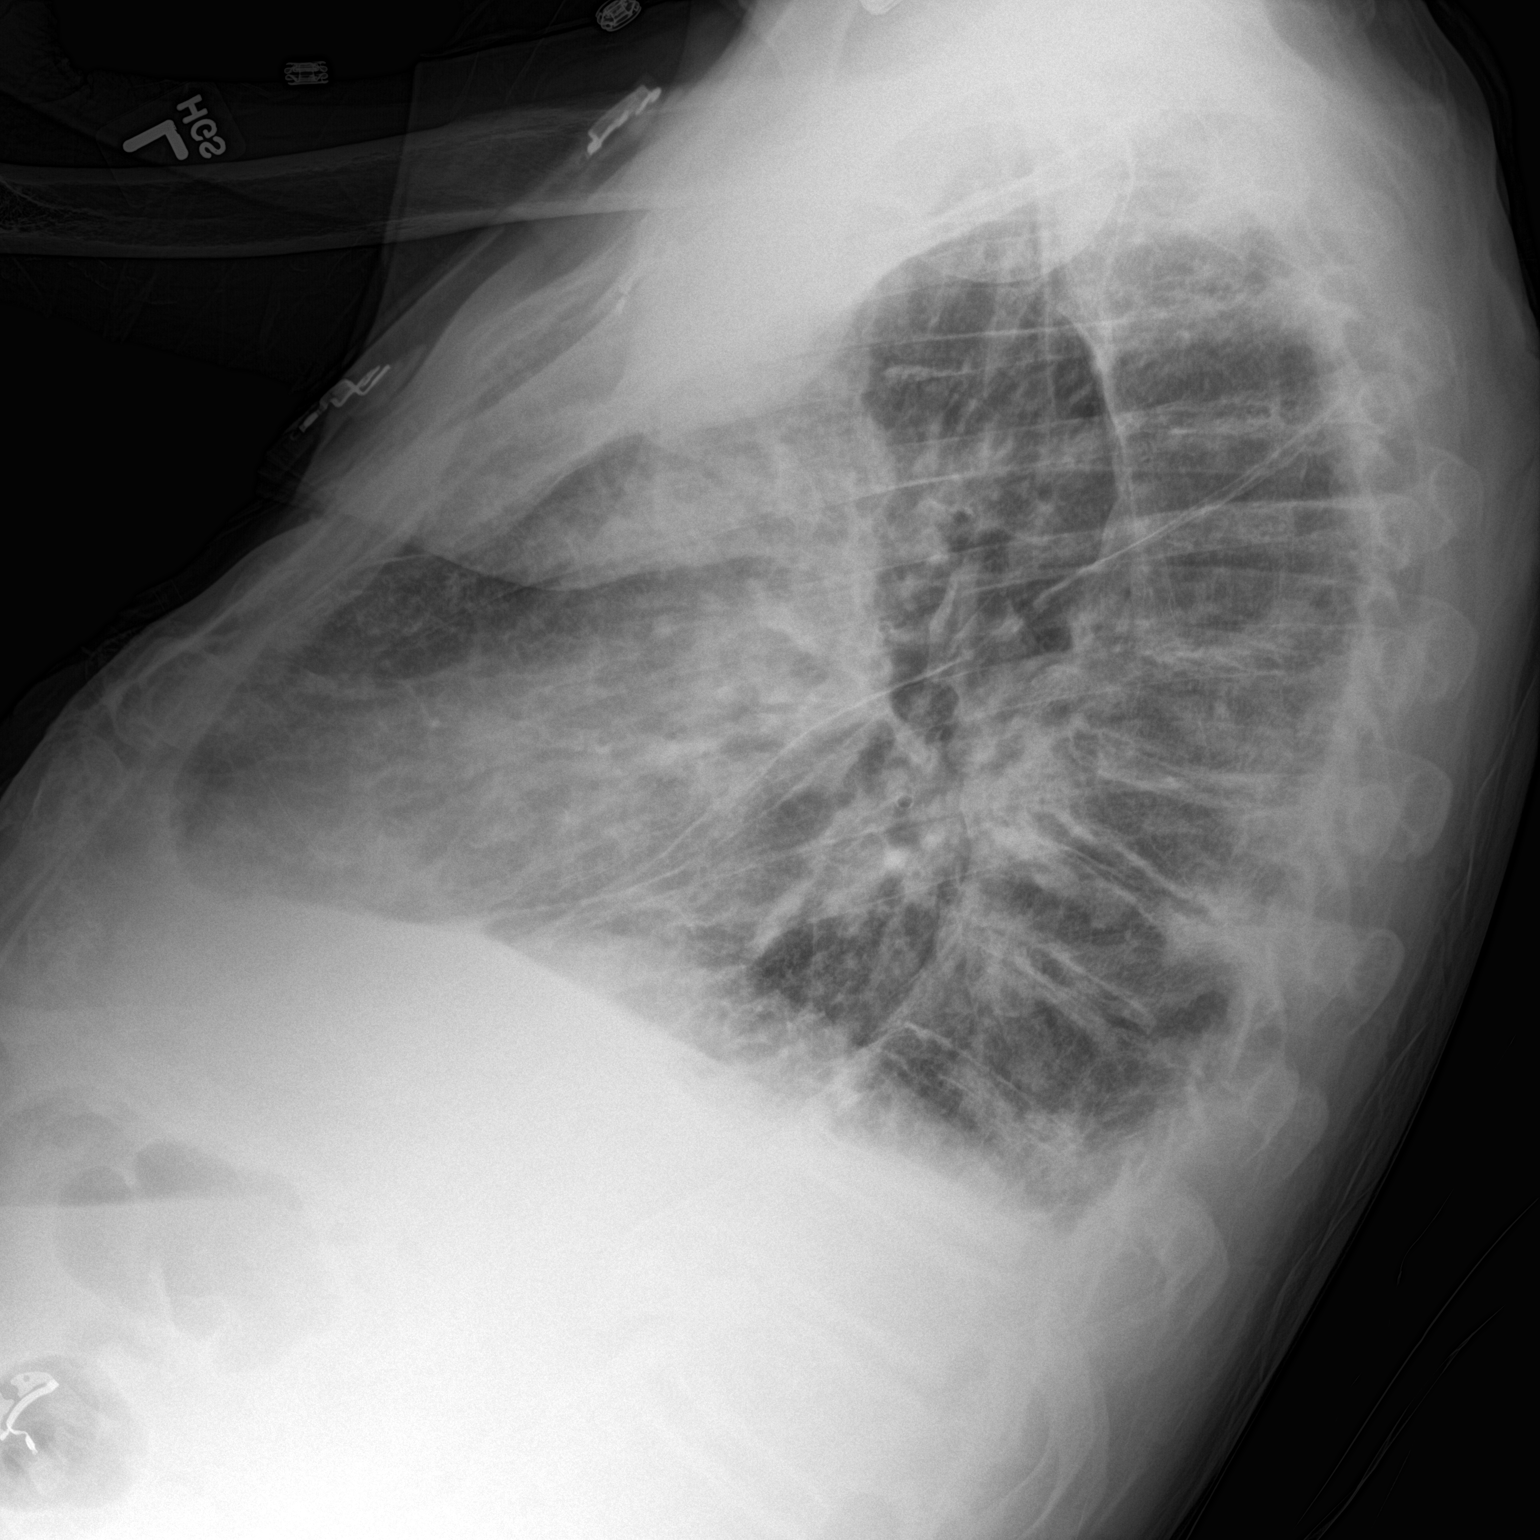
[im 2/2]
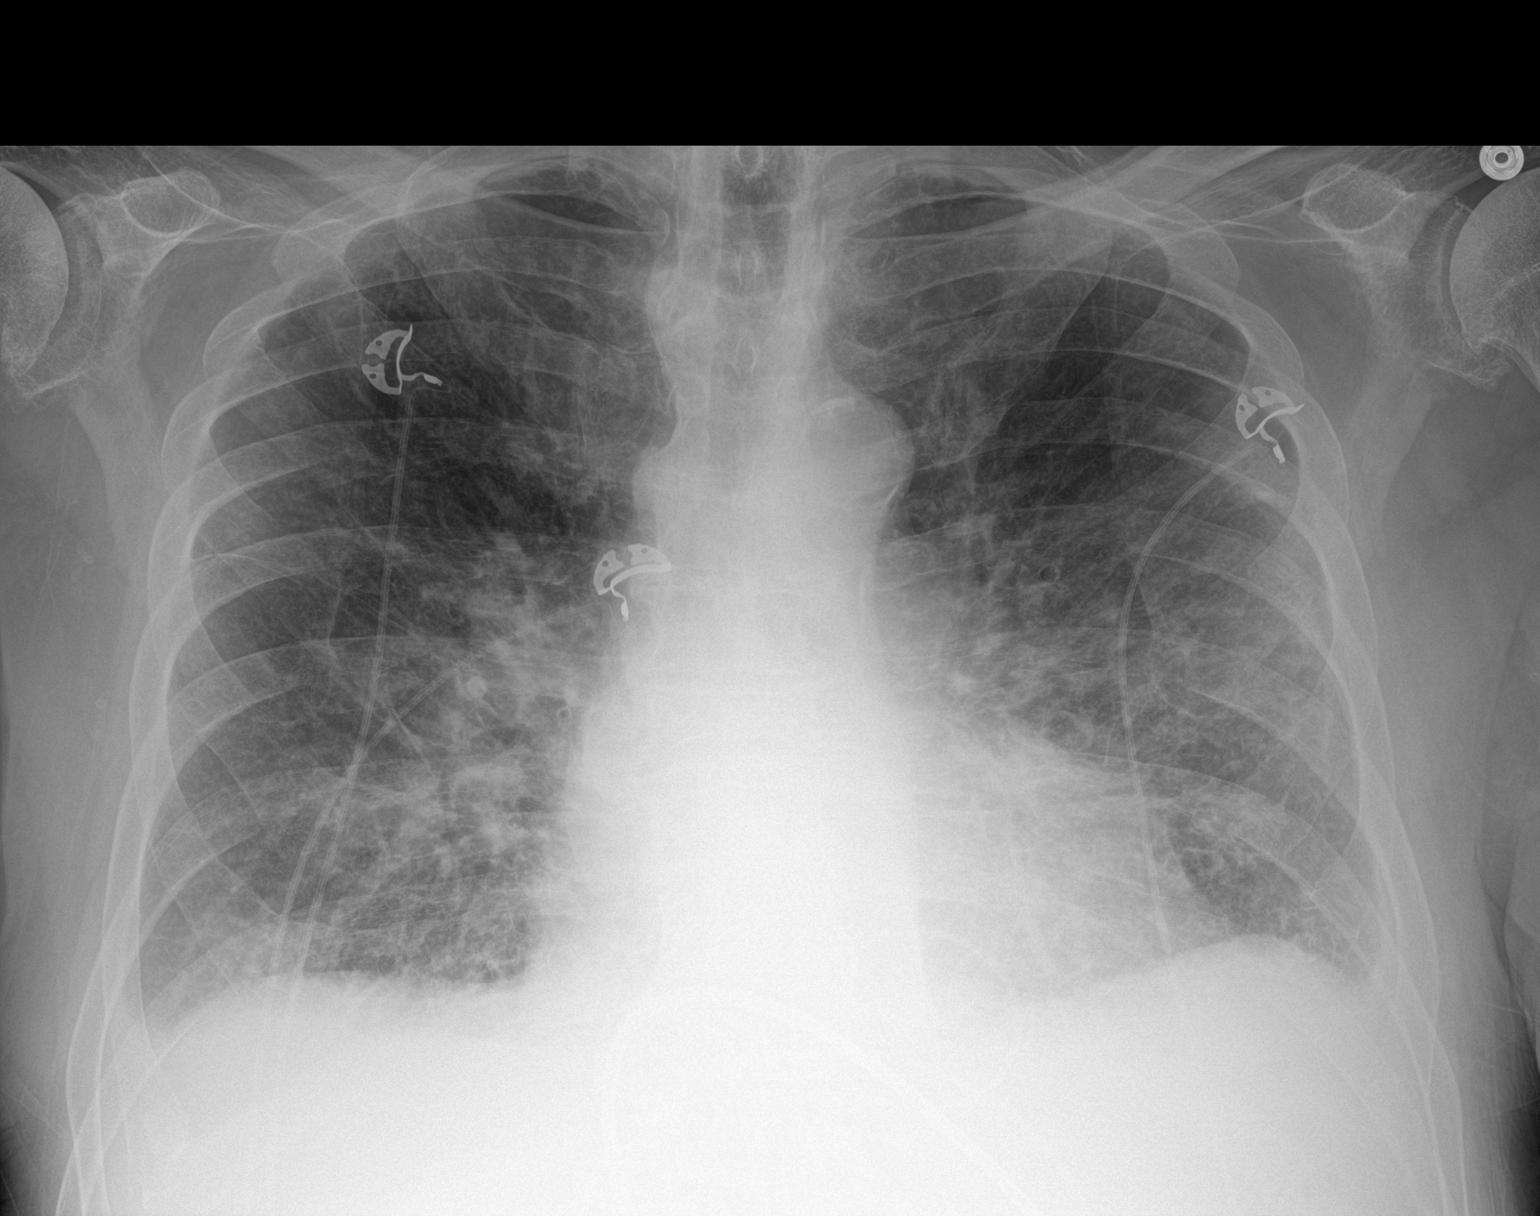

[2 of 2 positions shown; findings below may reference images not displayed]

FINDINGS: Stable heart and mediastinal contours. Mild prominence of the
central pulmonary vascularity. Atherosclerotic calcification of the
the thoracic aorta. Bilateral perihilar and bibasilar hazy/patchy
opacities are without significant interval change compared to chest
radiograph dated 11/28/2014.

Small bilateral pleural effusions and thickening of the fissures.

Chronic height loss of mid thoracic spine vertebral body and
multilevel degenerative changes of the thoracic spine.
IMPRESSION: No significant change in bilateral perihilar and basilar airspace
opacities and small bilateral pleural effusions compared to the
chest radiograph of 11/28/2014.

## 2015-09-21 IMAGING — CR DG CHEST 2V
1 series · 2 of 2 positions shown · non-contrast
Comparison: 11/30/2014

CLINICAL DATA: Congestive heart failure, aortic stenosis
hypertension, shortness of breath

EXAM:
CHEST  2 VIEW

[Series 1: dg chest 2 view · 0.14mm/px · 2 of 2 slices shown]
[im 1/2]
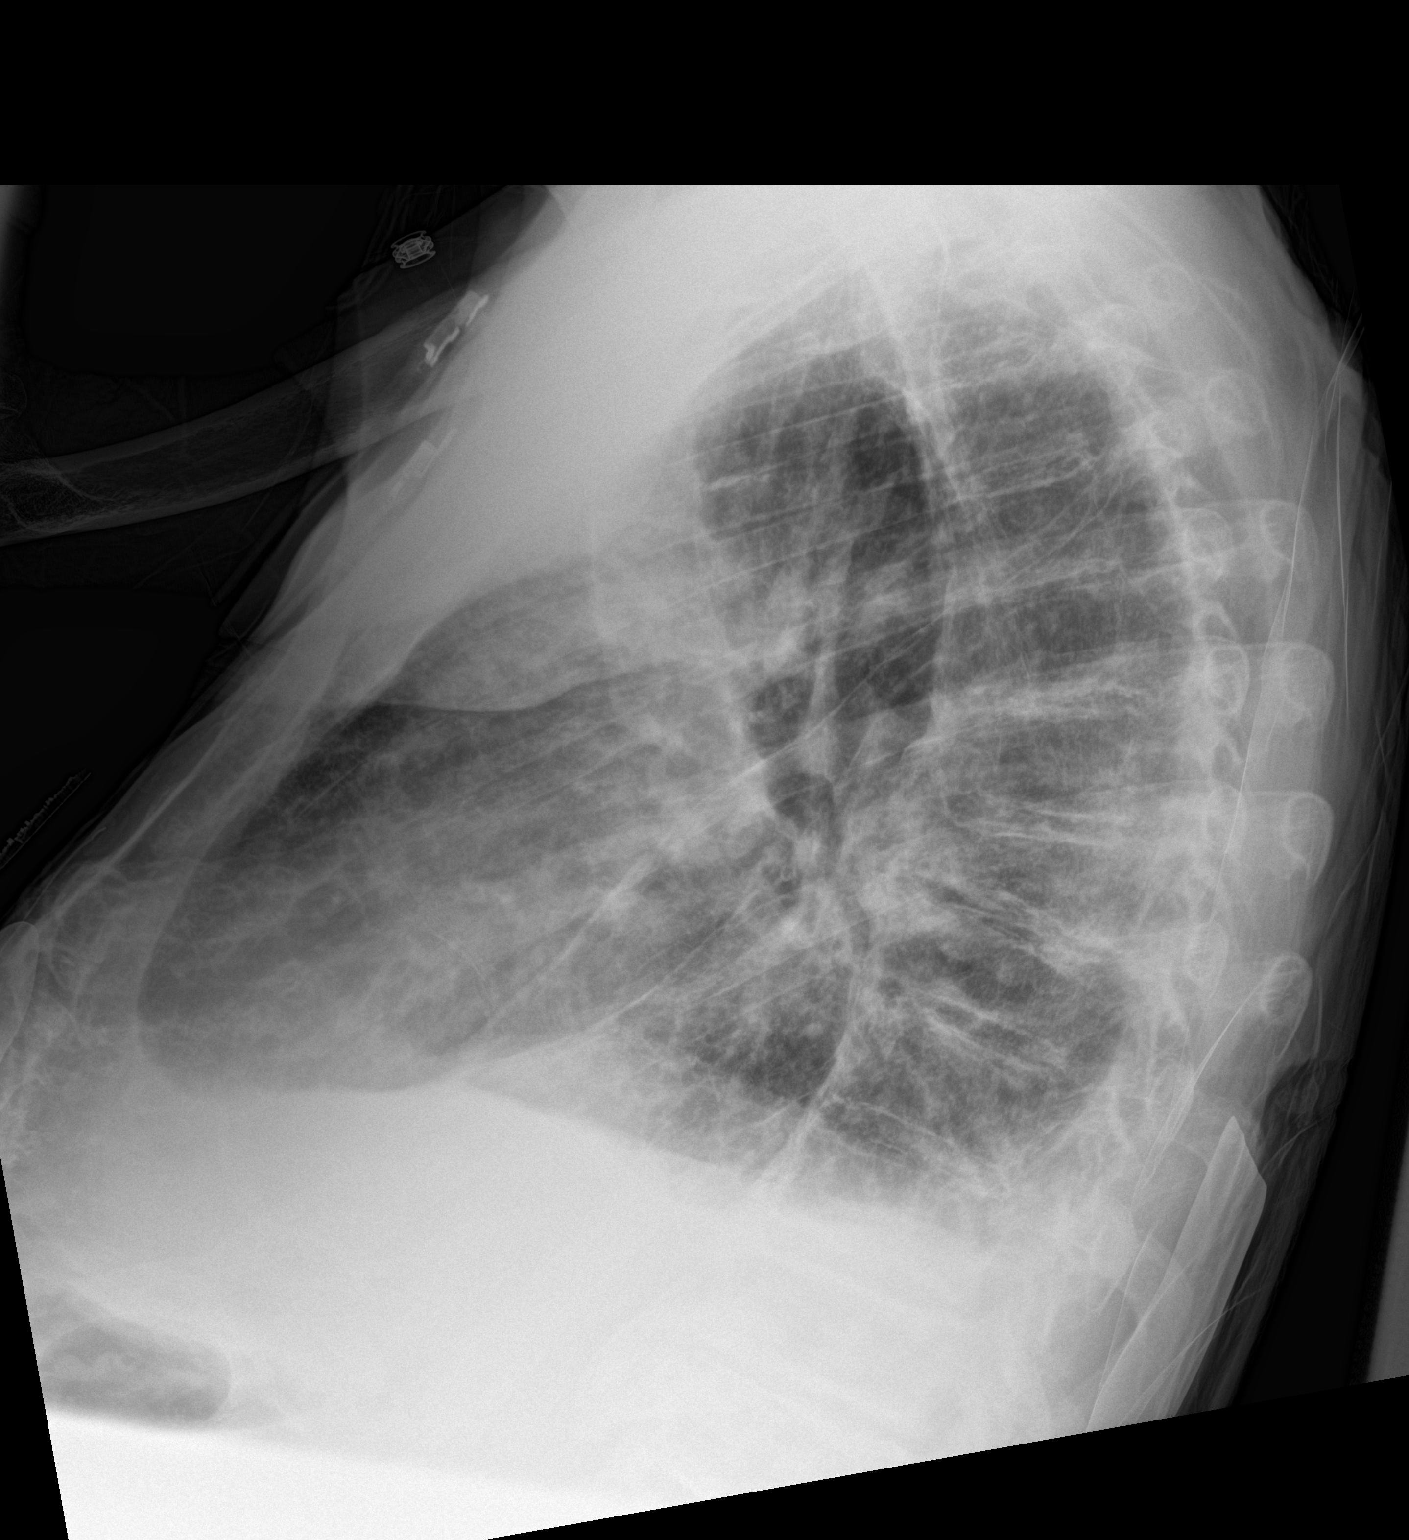
[im 2/2]
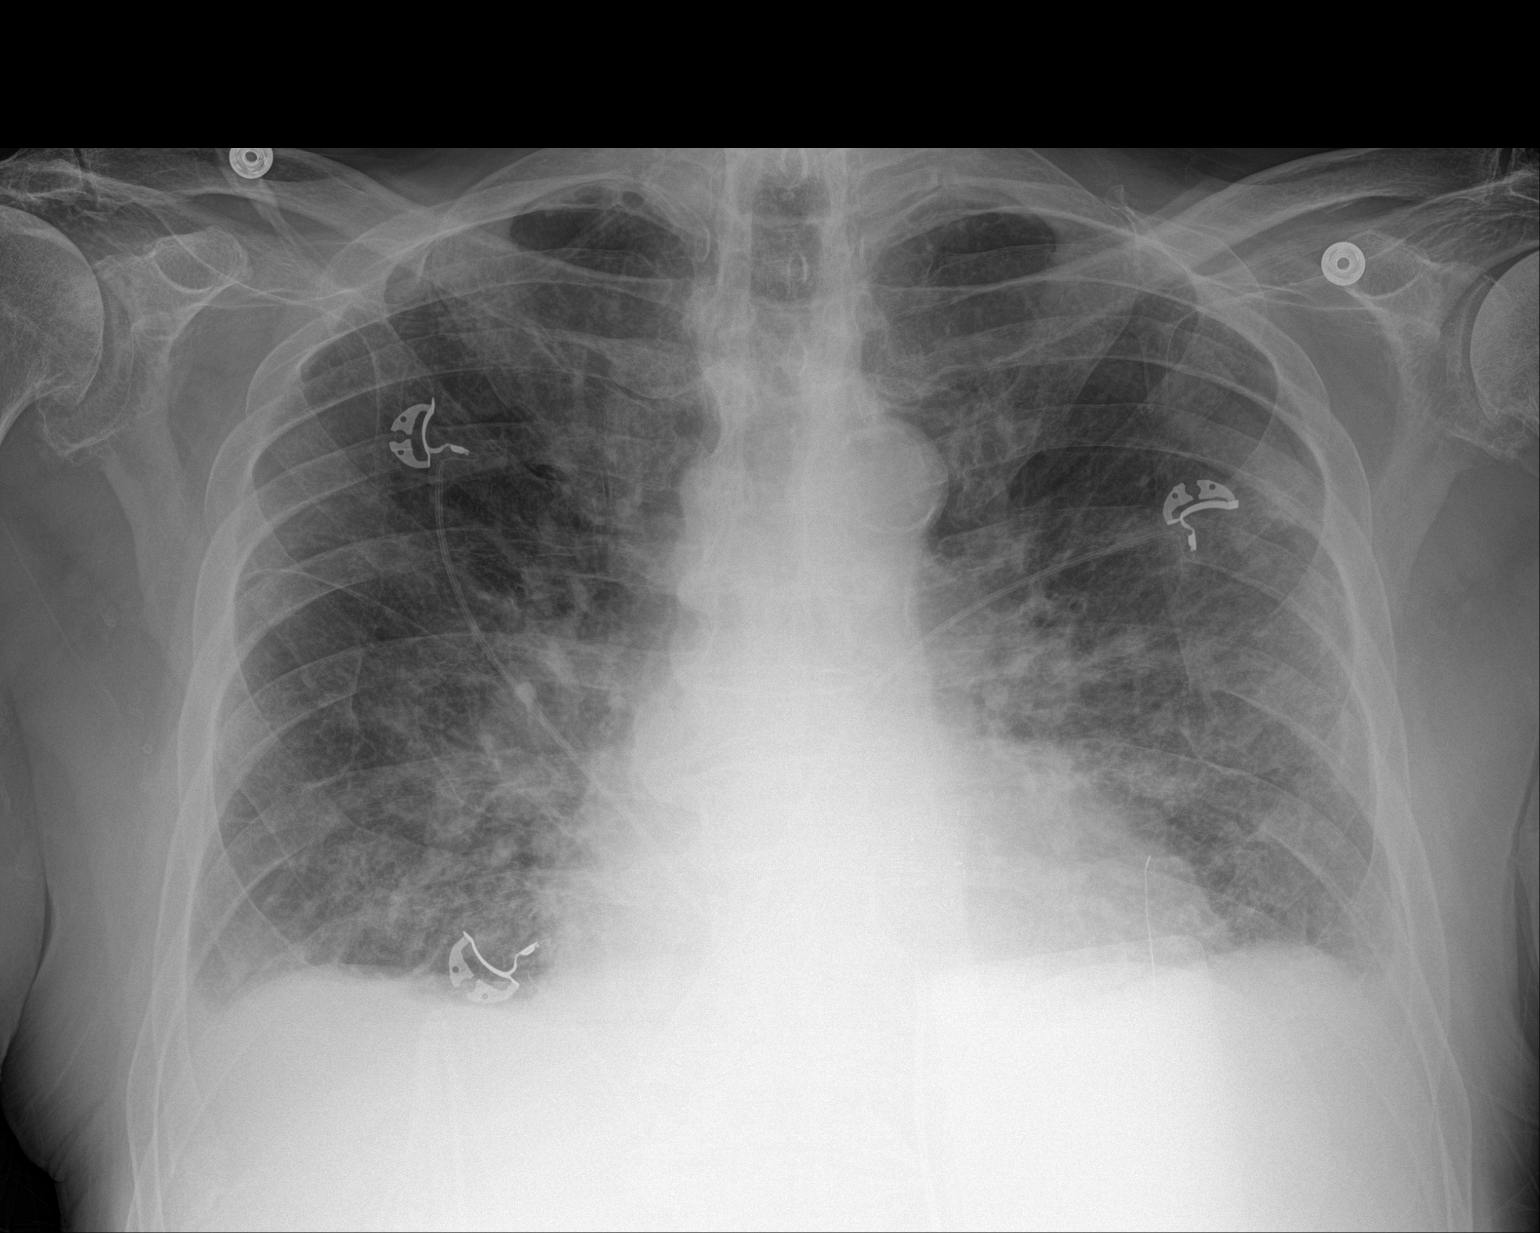

[2 of 2 positions shown; findings below may reference images not displayed]

FINDINGS: Moderate cardiac enlargement. Vascular congestion with indistinct
perihilar vessels bilaterally. Moderate diffuse interstitial
prominence. Tiny bilateral pleural effusions. Peribronchial wall
thickening stable.
IMPRESSION: No significant change from prior study with findings again
consistent with pulmonary edema related to congestive heart failure
